# Patient Record
Sex: Female | Born: 1935
Health system: Southern US, Community
[De-identification: ages and names within clinical notes are randomized; demographics above are authoritative.]

## PROBLEM LIST (undated history)

## (undated) DIAGNOSIS — F419 Anxiety disorder, unspecified: Secondary | ICD-10-CM

## (undated) DIAGNOSIS — E785 Hyperlipidemia, unspecified: Secondary | ICD-10-CM

## (undated) DIAGNOSIS — R339 Retention of urine, unspecified: Secondary | ICD-10-CM

## (undated) DIAGNOSIS — N390 Urinary tract infection, site not specified: Secondary | ICD-10-CM

## (undated) DIAGNOSIS — F0390 Unspecified dementia without behavioral disturbance: Secondary | ICD-10-CM

## (undated) DIAGNOSIS — I10 Essential (primary) hypertension: Secondary | ICD-10-CM

## (undated) DIAGNOSIS — J189 Pneumonia, unspecified organism: Secondary | ICD-10-CM

## (undated) HISTORY — PX: ABDOMINAL HYSTERECTOMY: SHX81

## (undated) HISTORY — PX: CATARACT EXTRACTION: SUR2

## (undated) HISTORY — DX: Retention of urine, unspecified: R33.9

## (undated) HISTORY — DX: Urinary tract infection, site not specified: N39.0

## (undated) HISTORY — DX: Anxiety disorder, unspecified: F41.9

## (undated) HISTORY — DX: Pneumonia, unspecified organism: J18.9

---

## 2008-03-20 ENCOUNTER — Ambulatory Visit: Payer: Self-pay | Admitting: Family Medicine

## 2009-04-29 ENCOUNTER — Ambulatory Visit: Payer: Self-pay | Admitting: Family Medicine

## 2010-05-19 ENCOUNTER — Ambulatory Visit: Payer: Self-pay | Admitting: Family Medicine

## 2011-02-11 ENCOUNTER — Ambulatory Visit: Payer: Self-pay | Admitting: Family Medicine

## 2012-10-17 ENCOUNTER — Emergency Department: Payer: Self-pay | Admitting: Unknown Physician Specialty

## 2012-10-17 LAB — URINALYSIS, COMPLETE
Bacteria: NONE SEEN
Bilirubin,UR: NEGATIVE
Blood: NEGATIVE
Glucose,UR: NEGATIVE mg/dL (ref 0–75)
Ketone: NEGATIVE
Leukocyte Esterase: NEGATIVE
Nitrite: NEGATIVE
Squamous Epithelial: NONE SEEN
WBC UR: 1 /HPF (ref 0–5)

## 2012-10-17 LAB — CBC
HGB: 14.4 g/dL (ref 12.0–16.0)
MCH: 31 pg (ref 26.0–34.0)
MCHC: 33.9 g/dL (ref 32.0–36.0)
MCV: 92 fL (ref 80–100)
RDW: 13.1 % (ref 11.5–14.5)

## 2012-10-17 LAB — COMPREHENSIVE METABOLIC PANEL
Albumin: 3.2 g/dL — ABNORMAL LOW (ref 3.4–5.0)
Bilirubin,Total: 0.7 mg/dL (ref 0.2–1.0)
Calcium, Total: 8.6 mg/dL (ref 8.5–10.1)
Co2: 25 mmol/L (ref 21–32)
EGFR (African American): >60
EGFR (Non-African Amer.): 52 — ABNORMAL LOW
Potassium: 3.2 mmol/L — ABNORMAL LOW (ref 3.5–5.1)
SGPT (ALT): 29 U/L (ref 12–78)
Sodium: 144 mmol/L (ref 136–145)

## 2012-10-17 LAB — CK TOTAL AND CKMB (NOT AT ARMC): CK, Total: 80 U/L (ref 21–215)

## 2012-10-17 LAB — TROPONIN I: Troponin-I: <0.02 ng/mL

## 2012-10-17 LAB — TSH: Thyroid Stimulating Horm: 1.77 u[IU]/mL

## 2012-10-23 LAB — CULTURE, BLOOD (SINGLE)

## 2013-09-01 DIAGNOSIS — S93409A Sprain of unspecified ligament of unspecified ankle, initial encounter: Secondary | ICD-10-CM | POA: Diagnosis not present

## 2013-09-01 DIAGNOSIS — L02619 Cutaneous abscess of unspecified foot: Secondary | ICD-10-CM | POA: Diagnosis not present

## 2013-09-04 DIAGNOSIS — E538 Deficiency of other specified B group vitamins: Secondary | ICD-10-CM | POA: Diagnosis not present

## 2013-09-04 DIAGNOSIS — G309 Alzheimer's disease, unspecified: Secondary | ICD-10-CM | POA: Diagnosis not present

## 2013-09-04 DIAGNOSIS — E785 Hyperlipidemia, unspecified: Secondary | ICD-10-CM | POA: Diagnosis not present

## 2013-09-04 DIAGNOSIS — F0280 Dementia in other diseases classified elsewhere without behavioral disturbance: Secondary | ICD-10-CM | POA: Diagnosis not present

## 2013-10-03 DIAGNOSIS — M6740 Ganglion, unspecified site: Secondary | ICD-10-CM | POA: Diagnosis not present

## 2013-10-03 DIAGNOSIS — G608 Other hereditary and idiopathic neuropathies: Secondary | ICD-10-CM | POA: Diagnosis not present

## 2013-10-03 DIAGNOSIS — L608 Other nail disorders: Secondary | ICD-10-CM | POA: Diagnosis not present

## 2013-10-12 DIAGNOSIS — R059 Cough, unspecified: Secondary | ICD-10-CM | POA: Diagnosis not present

## 2013-10-12 DIAGNOSIS — J3489 Other specified disorders of nose and nasal sinuses: Secondary | ICD-10-CM | POA: Diagnosis not present

## 2013-10-12 DIAGNOSIS — R05 Cough: Secondary | ICD-10-CM | POA: Diagnosis not present

## 2013-10-26 DIAGNOSIS — R059 Cough, unspecified: Secondary | ICD-10-CM | POA: Diagnosis not present

## 2013-10-26 DIAGNOSIS — R05 Cough: Secondary | ICD-10-CM | POA: Diagnosis not present

## 2013-12-04 DIAGNOSIS — F0280 Dementia in other diseases classified elsewhere without behavioral disturbance: Secondary | ICD-10-CM | POA: Diagnosis not present

## 2013-12-04 DIAGNOSIS — G309 Alzheimer's disease, unspecified: Secondary | ICD-10-CM | POA: Diagnosis not present

## 2014-04-27 DIAGNOSIS — Z23 Encounter for immunization: Secondary | ICD-10-CM | POA: Diagnosis not present

## 2014-05-21 DIAGNOSIS — G308 Other Alzheimer's disease: Secondary | ICD-10-CM | POA: Diagnosis not present

## 2014-05-21 DIAGNOSIS — H1013 Acute atopic conjunctivitis, bilateral: Secondary | ICD-10-CM | POA: Diagnosis not present

## 2014-05-21 DIAGNOSIS — F0281 Dementia in other diseases classified elsewhere with behavioral disturbance: Secondary | ICD-10-CM | POA: Diagnosis not present

## 2014-05-21 DIAGNOSIS — L821 Other seborrheic keratosis: Secondary | ICD-10-CM | POA: Diagnosis not present

## 2014-06-06 DIAGNOSIS — R2689 Other abnormalities of gait and mobility: Secondary | ICD-10-CM | POA: Diagnosis not present

## 2014-06-06 DIAGNOSIS — E538 Deficiency of other specified B group vitamins: Secondary | ICD-10-CM | POA: Diagnosis not present

## 2014-06-06 DIAGNOSIS — F0280 Dementia in other diseases classified elsewhere without behavioral disturbance: Secondary | ICD-10-CM | POA: Diagnosis not present

## 2014-06-06 DIAGNOSIS — G301 Alzheimer's disease with late onset: Secondary | ICD-10-CM | POA: Diagnosis not present

## 2014-06-06 DIAGNOSIS — E785 Hyperlipidemia, unspecified: Secondary | ICD-10-CM | POA: Diagnosis not present

## 2014-06-11 DIAGNOSIS — R2689 Other abnormalities of gait and mobility: Secondary | ICD-10-CM | POA: Diagnosis not present

## 2014-06-11 DIAGNOSIS — Z9181 History of falling: Secondary | ICD-10-CM | POA: Diagnosis not present

## 2014-06-11 DIAGNOSIS — F0280 Dementia in other diseases classified elsewhere without behavioral disturbance: Secondary | ICD-10-CM | POA: Diagnosis not present

## 2014-06-11 DIAGNOSIS — G309 Alzheimer's disease, unspecified: Secondary | ICD-10-CM | POA: Diagnosis not present

## 2014-06-18 DIAGNOSIS — G309 Alzheimer's disease, unspecified: Secondary | ICD-10-CM | POA: Diagnosis not present

## 2014-06-18 DIAGNOSIS — F0280 Dementia in other diseases classified elsewhere without behavioral disturbance: Secondary | ICD-10-CM | POA: Diagnosis not present

## 2014-06-18 DIAGNOSIS — R2689 Other abnormalities of gait and mobility: Secondary | ICD-10-CM | POA: Diagnosis not present

## 2014-06-18 DIAGNOSIS — Z9181 History of falling: Secondary | ICD-10-CM | POA: Diagnosis not present

## 2014-06-19 DIAGNOSIS — R2689 Other abnormalities of gait and mobility: Secondary | ICD-10-CM | POA: Diagnosis not present

## 2014-06-19 DIAGNOSIS — F0280 Dementia in other diseases classified elsewhere without behavioral disturbance: Secondary | ICD-10-CM | POA: Diagnosis not present

## 2014-06-19 DIAGNOSIS — G309 Alzheimer's disease, unspecified: Secondary | ICD-10-CM | POA: Diagnosis not present

## 2014-06-19 DIAGNOSIS — Z9181 History of falling: Secondary | ICD-10-CM | POA: Diagnosis not present

## 2014-06-25 DIAGNOSIS — G309 Alzheimer's disease, unspecified: Secondary | ICD-10-CM | POA: Diagnosis not present

## 2014-06-25 DIAGNOSIS — R2689 Other abnormalities of gait and mobility: Secondary | ICD-10-CM | POA: Diagnosis not present

## 2014-06-25 DIAGNOSIS — Z9181 History of falling: Secondary | ICD-10-CM | POA: Diagnosis not present

## 2014-06-25 DIAGNOSIS — F0280 Dementia in other diseases classified elsewhere without behavioral disturbance: Secondary | ICD-10-CM | POA: Diagnosis not present

## 2014-06-27 DIAGNOSIS — R2689 Other abnormalities of gait and mobility: Secondary | ICD-10-CM | POA: Diagnosis not present

## 2014-06-27 DIAGNOSIS — F0280 Dementia in other diseases classified elsewhere without behavioral disturbance: Secondary | ICD-10-CM | POA: Diagnosis not present

## 2014-06-27 DIAGNOSIS — Z9181 History of falling: Secondary | ICD-10-CM | POA: Diagnosis not present

## 2014-06-27 DIAGNOSIS — G309 Alzheimer's disease, unspecified: Secondary | ICD-10-CM | POA: Diagnosis not present

## 2014-07-03 DIAGNOSIS — R2689 Other abnormalities of gait and mobility: Secondary | ICD-10-CM | POA: Diagnosis not present

## 2014-07-03 DIAGNOSIS — F0280 Dementia in other diseases classified elsewhere without behavioral disturbance: Secondary | ICD-10-CM | POA: Diagnosis not present

## 2014-07-03 DIAGNOSIS — G309 Alzheimer's disease, unspecified: Secondary | ICD-10-CM | POA: Diagnosis not present

## 2014-07-03 DIAGNOSIS — Z9181 History of falling: Secondary | ICD-10-CM | POA: Diagnosis not present

## 2014-07-05 DIAGNOSIS — F0280 Dementia in other diseases classified elsewhere without behavioral disturbance: Secondary | ICD-10-CM | POA: Diagnosis not present

## 2014-07-05 DIAGNOSIS — G309 Alzheimer's disease, unspecified: Secondary | ICD-10-CM | POA: Diagnosis not present

## 2014-07-05 DIAGNOSIS — R2689 Other abnormalities of gait and mobility: Secondary | ICD-10-CM | POA: Diagnosis not present

## 2014-07-05 DIAGNOSIS — Z9181 History of falling: Secondary | ICD-10-CM | POA: Diagnosis not present

## 2014-07-10 DIAGNOSIS — R2689 Other abnormalities of gait and mobility: Secondary | ICD-10-CM | POA: Diagnosis not present

## 2014-07-10 DIAGNOSIS — F0280 Dementia in other diseases classified elsewhere without behavioral disturbance: Secondary | ICD-10-CM | POA: Diagnosis not present

## 2014-07-10 DIAGNOSIS — G309 Alzheimer's disease, unspecified: Secondary | ICD-10-CM | POA: Diagnosis not present

## 2014-07-10 DIAGNOSIS — Z9181 History of falling: Secondary | ICD-10-CM | POA: Diagnosis not present

## 2014-07-12 DIAGNOSIS — Z9181 History of falling: Secondary | ICD-10-CM | POA: Diagnosis not present

## 2014-07-12 DIAGNOSIS — R2689 Other abnormalities of gait and mobility: Secondary | ICD-10-CM | POA: Diagnosis not present

## 2014-07-12 DIAGNOSIS — G309 Alzheimer's disease, unspecified: Secondary | ICD-10-CM | POA: Diagnosis not present

## 2014-07-12 DIAGNOSIS — F0280 Dementia in other diseases classified elsewhere without behavioral disturbance: Secondary | ICD-10-CM | POA: Diagnosis not present

## 2014-07-16 DIAGNOSIS — L239 Allergic contact dermatitis, unspecified cause: Secondary | ICD-10-CM | POA: Diagnosis not present

## 2014-07-16 DIAGNOSIS — S00411A Abrasion of right ear, initial encounter: Secondary | ICD-10-CM | POA: Diagnosis not present

## 2014-07-17 DIAGNOSIS — Z9181 History of falling: Secondary | ICD-10-CM | POA: Diagnosis not present

## 2014-07-17 DIAGNOSIS — F0280 Dementia in other diseases classified elsewhere without behavioral disturbance: Secondary | ICD-10-CM | POA: Diagnosis not present

## 2014-07-17 DIAGNOSIS — G309 Alzheimer's disease, unspecified: Secondary | ICD-10-CM | POA: Diagnosis not present

## 2014-07-17 DIAGNOSIS — R2689 Other abnormalities of gait and mobility: Secondary | ICD-10-CM | POA: Diagnosis not present

## 2014-07-20 DIAGNOSIS — Z9181 History of falling: Secondary | ICD-10-CM | POA: Diagnosis not present

## 2014-07-20 DIAGNOSIS — R2689 Other abnormalities of gait and mobility: Secondary | ICD-10-CM | POA: Diagnosis not present

## 2014-07-20 DIAGNOSIS — F0280 Dementia in other diseases classified elsewhere without behavioral disturbance: Secondary | ICD-10-CM | POA: Diagnosis not present

## 2014-07-20 DIAGNOSIS — G309 Alzheimer's disease, unspecified: Secondary | ICD-10-CM | POA: Diagnosis not present

## 2014-07-24 DIAGNOSIS — R2689 Other abnormalities of gait and mobility: Secondary | ICD-10-CM | POA: Diagnosis not present

## 2014-07-24 DIAGNOSIS — F0280 Dementia in other diseases classified elsewhere without behavioral disturbance: Secondary | ICD-10-CM | POA: Diagnosis not present

## 2014-07-24 DIAGNOSIS — G309 Alzheimer's disease, unspecified: Secondary | ICD-10-CM | POA: Diagnosis not present

## 2014-07-24 DIAGNOSIS — Z9181 History of falling: Secondary | ICD-10-CM | POA: Diagnosis not present

## 2014-07-25 DIAGNOSIS — G309 Alzheimer's disease, unspecified: Secondary | ICD-10-CM | POA: Diagnosis not present

## 2014-07-25 DIAGNOSIS — F0280 Dementia in other diseases classified elsewhere without behavioral disturbance: Secondary | ICD-10-CM | POA: Diagnosis not present

## 2014-07-25 DIAGNOSIS — R2689 Other abnormalities of gait and mobility: Secondary | ICD-10-CM | POA: Diagnosis not present

## 2014-07-25 DIAGNOSIS — Z9181 History of falling: Secondary | ICD-10-CM | POA: Diagnosis not present

## 2014-07-30 DIAGNOSIS — R2689 Other abnormalities of gait and mobility: Secondary | ICD-10-CM | POA: Diagnosis not present

## 2014-07-30 DIAGNOSIS — Z9181 History of falling: Secondary | ICD-10-CM | POA: Diagnosis not present

## 2014-07-30 DIAGNOSIS — F0280 Dementia in other diseases classified elsewhere without behavioral disturbance: Secondary | ICD-10-CM | POA: Diagnosis not present

## 2014-07-30 DIAGNOSIS — G309 Alzheimer's disease, unspecified: Secondary | ICD-10-CM | POA: Diagnosis not present

## 2014-08-02 DIAGNOSIS — G309 Alzheimer's disease, unspecified: Secondary | ICD-10-CM | POA: Diagnosis not present

## 2014-08-02 DIAGNOSIS — R2689 Other abnormalities of gait and mobility: Secondary | ICD-10-CM | POA: Diagnosis not present

## 2014-08-02 DIAGNOSIS — Z9181 History of falling: Secondary | ICD-10-CM | POA: Diagnosis not present

## 2014-08-02 DIAGNOSIS — F0280 Dementia in other diseases classified elsewhere without behavioral disturbance: Secondary | ICD-10-CM | POA: Diagnosis not present

## 2014-08-06 DIAGNOSIS — Z9181 History of falling: Secondary | ICD-10-CM | POA: Diagnosis not present

## 2014-08-06 DIAGNOSIS — R2689 Other abnormalities of gait and mobility: Secondary | ICD-10-CM | POA: Diagnosis not present

## 2014-08-06 DIAGNOSIS — F0280 Dementia in other diseases classified elsewhere without behavioral disturbance: Secondary | ICD-10-CM | POA: Diagnosis not present

## 2014-08-06 DIAGNOSIS — G309 Alzheimer's disease, unspecified: Secondary | ICD-10-CM | POA: Diagnosis not present

## 2014-08-21 DIAGNOSIS — Z23 Encounter for immunization: Secondary | ICD-10-CM | POA: Diagnosis not present

## 2014-08-21 DIAGNOSIS — E538 Deficiency of other specified B group vitamins: Secondary | ICD-10-CM | POA: Diagnosis not present

## 2014-08-21 DIAGNOSIS — Z0000 Encounter for general adult medical examination without abnormal findings: Secondary | ICD-10-CM | POA: Diagnosis not present

## 2014-08-21 DIAGNOSIS — E785 Hyperlipidemia, unspecified: Secondary | ICD-10-CM | POA: Diagnosis not present

## 2014-09-20 DIAGNOSIS — F0281 Dementia in other diseases classified elsewhere with behavioral disturbance: Secondary | ICD-10-CM | POA: Diagnosis not present

## 2014-09-20 DIAGNOSIS — F411 Generalized anxiety disorder: Secondary | ICD-10-CM | POA: Diagnosis not present

## 2014-10-24 DIAGNOSIS — L309 Dermatitis, unspecified: Secondary | ICD-10-CM | POA: Diagnosis not present

## 2014-11-02 DIAGNOSIS — R21 Rash and other nonspecific skin eruption: Secondary | ICD-10-CM | POA: Diagnosis not present

## 2014-11-05 DIAGNOSIS — L239 Allergic contact dermatitis, unspecified cause: Secondary | ICD-10-CM | POA: Diagnosis not present

## 2014-11-13 DIAGNOSIS — L239 Allergic contact dermatitis, unspecified cause: Secondary | ICD-10-CM | POA: Diagnosis not present

## 2014-11-14 ENCOUNTER — Inpatient Hospital Stay
Admit: 2014-11-14 | Disposition: A | Discharge status: Skilled Nursing Facility | Payer: Self-pay | Attending: Internal Medicine | Admitting: Internal Medicine

## 2014-11-14 DIAGNOSIS — R0602 Shortness of breath: Secondary | ICD-10-CM | POA: Diagnosis not present

## 2014-11-14 DIAGNOSIS — J189 Pneumonia, unspecified organism: Secondary | ICD-10-CM | POA: Diagnosis not present

## 2014-11-14 DIAGNOSIS — L259 Unspecified contact dermatitis, unspecified cause: Secondary | ICD-10-CM | POA: Diagnosis present

## 2014-11-14 DIAGNOSIS — I10 Essential (primary) hypertension: Secondary | ICD-10-CM | POA: Diagnosis not present

## 2014-11-14 DIAGNOSIS — E876 Hypokalemia: Secondary | ICD-10-CM | POA: Diagnosis present

## 2014-11-14 DIAGNOSIS — R21 Rash and other nonspecific skin eruption: Secondary | ICD-10-CM | POA: Diagnosis present

## 2014-11-14 DIAGNOSIS — M6281 Muscle weakness (generalized): Secondary | ICD-10-CM | POA: Diagnosis not present

## 2014-11-14 DIAGNOSIS — Z79899 Other long term (current) drug therapy: Secondary | ICD-10-CM | POA: Diagnosis not present

## 2014-11-14 DIAGNOSIS — Z66 Do not resuscitate: Secondary | ICD-10-CM | POA: Diagnosis present

## 2014-11-14 DIAGNOSIS — F0390 Unspecified dementia without behavioral disturbance: Secondary | ICD-10-CM | POA: Diagnosis present

## 2014-11-14 DIAGNOSIS — R262 Difficulty in walking, not elsewhere classified: Secondary | ICD-10-CM | POA: Diagnosis not present

## 2014-11-14 DIAGNOSIS — R509 Fever, unspecified: Secondary | ICD-10-CM | POA: Diagnosis not present

## 2014-11-14 DIAGNOSIS — J9811 Atelectasis: Secondary | ICD-10-CM | POA: Diagnosis not present

## 2014-11-14 DIAGNOSIS — R339 Retention of urine, unspecified: Secondary | ICD-10-CM | POA: Diagnosis present

## 2014-11-14 DIAGNOSIS — E785 Hyperlipidemia, unspecified: Secondary | ICD-10-CM | POA: Diagnosis not present

## 2014-11-14 DIAGNOSIS — J90 Pleural effusion, not elsewhere classified: Secondary | ICD-10-CM | POA: Diagnosis present

## 2014-11-14 DIAGNOSIS — Z466 Encounter for fitting and adjustment of urinary device: Secondary | ICD-10-CM | POA: Diagnosis not present

## 2014-11-14 DIAGNOSIS — R419 Unspecified symptoms and signs involving cognitive functions and awareness: Secondary | ICD-10-CM | POA: Diagnosis not present

## 2014-11-14 DIAGNOSIS — F0391 Unspecified dementia with behavioral disturbance: Secondary | ICD-10-CM | POA: Diagnosis not present

## 2014-11-14 DIAGNOSIS — R05 Cough: Secondary | ICD-10-CM | POA: Diagnosis not present

## 2014-11-14 LAB — URINALYSIS, COMPLETE
BACTERIA: NONE SEEN
BILIRUBIN, UR: NEGATIVE
Blood: NEGATIVE
GLUCOSE, UR: NEGATIVE mg/dL (ref 0–75)
LEUKOCYTE ESTERASE: NEGATIVE
Nitrite: NEGATIVE
Ph: 5 (ref 4.5–8.0)
Protein: 30
Specific Gravity: 1.03 (ref 1.003–1.030)

## 2014-11-14 LAB — COMPREHENSIVE METABOLIC PANEL
ALT: 16 U/L
ANION GAP: 7 (ref 7–16)
Albumin: 3 g/dL — ABNORMAL LOW
Alkaline Phosphatase: 83 U/L
BUN: 11 mg/dL
Bilirubin,Total: 1.4 mg/dL — ABNORMAL HIGH
CHLORIDE: 106 mmol/L
CREATININE: 0.92 mg/dL
Calcium, Total: 8.5 mg/dL — ABNORMAL LOW
Co2: 23 mmol/L
EGFR (African American): >60
GFR CALC NON AF AMER: 60 — AB
Glucose: 117 mg/dL — ABNORMAL HIGH
POTASSIUM: 3.4 mmol/L — AB
SGOT(AST): 21 U/L
Sodium: 136 mmol/L
TOTAL PROTEIN: 6.6 g/dL

## 2014-11-14 LAB — ED INFLUENZA
Influenza A By PCR: NEGATIVE
Influenza B By PCR: NEGATIVE

## 2014-11-14 LAB — CBC WITH DIFFERENTIAL/PLATELET
BASOS ABS: 0 10*3/uL (ref 0.0–0.1)
Basophil %: 0.5 %
Eosinophil #: 0.1 10*3/uL (ref 0.0–0.7)
Eosinophil %: 0.6 %
HCT: 42.8 % (ref 35.0–47.0)
HGB: 14.6 g/dL (ref 12.0–16.0)
LYMPHS PCT: 11.7 %
Lymphocyte #: 1.2 10*3/uL (ref 1.0–3.6)
MCH: 31.1 pg (ref 26.0–34.0)
MCHC: 34.1 g/dL (ref 32.0–36.0)
MCV: 91 fL (ref 80–100)
MONO ABS: 0.8 x10 3/mm (ref 0.2–0.9)
MONOS PCT: 7.4 %
NEUTROS PCT: 79.8 %
Neutrophil #: 8.2 10*3/uL — ABNORMAL HIGH (ref 1.4–6.5)
Platelet: 140 10*3/uL — ABNORMAL LOW (ref 150–440)
RBC: 4.69 10*6/uL (ref 3.80–5.20)
RDW: 12.8 % (ref 11.5–14.5)
WBC: 10.3 10*3/uL (ref 3.6–11.0)

## 2014-11-15 LAB — BASIC METABOLIC PANEL
ANION GAP: 8 (ref 7–16)
BUN: 9 mg/dL
CALCIUM: 8.5 mg/dL — AB
CHLORIDE: 104 mmol/L
Co2: 25 mmol/L
Creatinine: 0.82 mg/dL
Glucose: 115 mg/dL — ABNORMAL HIGH
POTASSIUM: 3.5 mmol/L
Sodium: 137 mmol/L

## 2014-11-15 LAB — CBC WITH DIFFERENTIAL/PLATELET
BASOS ABS: 0 10*3/uL (ref 0.0–0.1)
BASOS PCT: 0.1 %
EOS ABS: 0 10*3/uL (ref 0.0–0.7)
Eosinophil %: 0.2 %
HCT: 43.5 % (ref 35.0–47.0)
HGB: 14.7 g/dL (ref 12.0–16.0)
LYMPHS ABS: 0.7 10*3/uL — AB (ref 1.0–3.6)
Lymphocyte %: 7.2 %
MCH: 31.1 pg (ref 26.0–34.0)
MCHC: 33.8 g/dL (ref 32.0–36.0)
MCV: 92 fL (ref 80–100)
MONO ABS: 0.7 x10 3/mm (ref 0.2–0.9)
Monocyte %: 7.1 %
NEUTROS ABS: 8.4 10*3/uL — AB (ref 1.4–6.5)
Neutrophil %: 85.4 %
Platelet: 139 10*3/uL — ABNORMAL LOW (ref 150–440)
RBC: 4.73 10*6/uL (ref 3.80–5.20)
RDW: 12.8 % (ref 11.5–14.5)
WBC: 9.9 10*3/uL (ref 3.6–11.0)

## 2014-11-17 DIAGNOSIS — R0682 Tachypnea, not elsewhere classified: Secondary | ICD-10-CM | POA: Diagnosis not present

## 2014-11-17 DIAGNOSIS — Z466 Encounter for fitting and adjustment of urinary device: Secondary | ICD-10-CM | POA: Diagnosis not present

## 2014-11-17 DIAGNOSIS — R630 Anorexia: Secondary | ICD-10-CM | POA: Diagnosis not present

## 2014-11-17 DIAGNOSIS — J984 Other disorders of lung: Secondary | ICD-10-CM | POA: Diagnosis not present

## 2014-11-17 DIAGNOSIS — G301 Alzheimer's disease with late onset: Secondary | ICD-10-CM | POA: Diagnosis not present

## 2014-11-17 DIAGNOSIS — F0391 Unspecified dementia with behavioral disturbance: Secondary | ICD-10-CM | POA: Diagnosis not present

## 2014-11-17 DIAGNOSIS — J189 Pneumonia, unspecified organism: Secondary | ICD-10-CM | POA: Diagnosis not present

## 2014-11-17 DIAGNOSIS — E785 Hyperlipidemia, unspecified: Secondary | ICD-10-CM | POA: Diagnosis not present

## 2014-11-17 DIAGNOSIS — J181 Lobar pneumonia, unspecified organism: Secondary | ICD-10-CM | POA: Diagnosis not present

## 2014-11-17 DIAGNOSIS — R339 Retention of urine, unspecified: Secondary | ICD-10-CM | POA: Diagnosis not present

## 2014-11-17 DIAGNOSIS — J9611 Chronic respiratory failure with hypoxia: Secondary | ICD-10-CM | POA: Diagnosis not present

## 2014-11-17 DIAGNOSIS — F0280 Dementia in other diseases classified elsewhere without behavioral disturbance: Secondary | ICD-10-CM | POA: Diagnosis not present

## 2014-11-17 DIAGNOSIS — M6281 Muscle weakness (generalized): Secondary | ICD-10-CM | POA: Diagnosis not present

## 2014-11-17 DIAGNOSIS — R509 Fever, unspecified: Secondary | ICD-10-CM | POA: Diagnosis not present

## 2014-11-17 DIAGNOSIS — R0902 Hypoxemia: Secondary | ICD-10-CM | POA: Diagnosis not present

## 2014-11-17 DIAGNOSIS — R05 Cough: Secondary | ICD-10-CM | POA: Diagnosis not present

## 2014-11-17 DIAGNOSIS — J90 Pleural effusion, not elsewhere classified: Secondary | ICD-10-CM | POA: Diagnosis not present

## 2014-11-17 DIAGNOSIS — M4185 Other forms of scoliosis, thoracolumbar region: Secondary | ICD-10-CM | POA: Diagnosis not present

## 2014-11-17 DIAGNOSIS — R419 Unspecified symptoms and signs involving cognitive functions and awareness: Secondary | ICD-10-CM | POA: Diagnosis not present

## 2014-11-17 DIAGNOSIS — R0609 Other forms of dyspnea: Secondary | ICD-10-CM | POA: Diagnosis not present

## 2014-11-17 DIAGNOSIS — F329 Major depressive disorder, single episode, unspecified: Secondary | ICD-10-CM | POA: Diagnosis not present

## 2014-11-17 DIAGNOSIS — R0789 Other chest pain: Secondary | ICD-10-CM | POA: Diagnosis not present

## 2014-11-17 DIAGNOSIS — F0390 Unspecified dementia without behavioral disturbance: Secondary | ICD-10-CM | POA: Diagnosis not present

## 2014-11-17 DIAGNOSIS — R0602 Shortness of breath: Secondary | ICD-10-CM | POA: Diagnosis not present

## 2014-11-17 DIAGNOSIS — R262 Difficulty in walking, not elsewhere classified: Secondary | ICD-10-CM | POA: Diagnosis not present

## 2014-11-17 DIAGNOSIS — I10 Essential (primary) hypertension: Secondary | ICD-10-CM | POA: Diagnosis not present

## 2014-11-17 DIAGNOSIS — M47814 Spondylosis without myelopathy or radiculopathy, thoracic region: Secondary | ICD-10-CM | POA: Diagnosis not present

## 2014-11-17 DIAGNOSIS — R1011 Right upper quadrant pain: Secondary | ICD-10-CM | POA: Diagnosis not present

## 2014-11-19 LAB — CULTURE, BLOOD (SINGLE)

## 2014-11-20 DIAGNOSIS — R339 Retention of urine, unspecified: Secondary | ICD-10-CM | POA: Diagnosis not present

## 2014-11-20 DIAGNOSIS — F0390 Unspecified dementia without behavioral disturbance: Secondary | ICD-10-CM | POA: Diagnosis not present

## 2014-11-20 DIAGNOSIS — J181 Lobar pneumonia, unspecified organism: Secondary | ICD-10-CM | POA: Diagnosis not present

## 2014-11-22 DIAGNOSIS — R339 Retention of urine, unspecified: Secondary | ICD-10-CM | POA: Diagnosis not present

## 2014-12-02 NOTE — Discharge Summary (Signed)
PATIENT NAME:  Jordan [name], Jordan [name] MR#:  [id number] DATE OF BIRTH:  1934/10/03  DATE OF ADMISSION:  11/14/2014 DATE OF DISCHARGE:  11/17/2014  PRIMARY CARE PHYSICIAN: Katina DungBarbara D. Dayna BarkerAldridge, MD  FINAL DIAGNOSES: 1.  Pneumonia, likely community-acquired pneumonia. Could also be aspiration, with the pneumonia in the right upper lobe.  2.  History of dementia.  3.  Urinary retention, requiring straight catheterization.  MEDICATIONS ON DISCHARGE: Include: 1.  Fluocinonide topical  to affected area twice a day. 2.  Hydrocort cream 0.2 mg applied to face twice a day. 3.  Loratadine 10 mg daily. 4.  Namenda XR 28 mg daily. 5.  Pataday 0.2% ophthalmic solution, 1 drop, affected eye, once a day in the morning. 6.  Vitamin B12, 1000 mcg daily. 7.  Loperamide 2 mg every 3 hours as needed for diarrhea. 8.  Lorazepam 0.5 mg 1 tablet every 6 hours as needed for anxiety. 9.  Tylenol 500 mg every 4 hours as needed for fever or pain. 10.  Mylanta 30 mL q.4 hours as needed for indigestion. 11.  Milk of magnesia 8%, 30 mL once a day at bedtime as needed for constipation. 12.  Q-Tussin 10 mL every 6 hours as needed for cough. 13.  Triple antibiotic ointment, apply to affected areas p.r.n. for skin tears, abrasions, or minor irritation. 14.  Flomax 0.4 mg once a day after meal. 15.  Levaquin 750 mg every 24 hours for 5 days.  DIET: Regular diet, regular consistency.  ACTIVITY: As tolerated.  FOLLOWUP: 1.  One to 2 weeks with medical doctor or 1-2 days with doctor at rehabilitation. 2.  In-and-out catheter t.i.d. as needed for urinary retention. The patient will be high risk for urinary infections. Can consider a urology referral as outpatient.   HOSPITAL COURSE: The patient was admitted 11/14/2014, discharged to rehabilitation 11/17/2014. Came in with a fever. Admitted with pneumonia, right upper lobe. Started on antibiotics.  LABORATORY AND RADIOLOGICAL DATA DURING THE HOSPITAL COURSE: Included chest  x-ray shows right upper lobe infiltrate. Glucose 117, BUN 11, creatinine 0.92, sodium 136, potassium 3.4, chloride 106, CO2 of 23, calcium 8.5, total bilirubin 1.4. Other liver function tests normal range except for albumin low at 3.0. White blood cell count 10.3, [name] and [name] 14.6 and 42.8, platelet count of 140,000. Influenza test negative. Blood cultures negative. Urinalysis negative. White count upon discharge 9.9. Creatinine upon discharge 0.82. Potassium 3.5.  HOSPITAL COURSE PER PROBLEM LIST: 1.  For the patient's pneumonia, right upper lobe, likely community-acquired pneumonia. Could also be aspiration. Levaquin should cover both. Finish up a course of Levaquin. 2.  History of dementia on Namenda. 3.  Urinary retention requiring intermittent catheterizations. Flomax was started by my associate; not quite sure if that will help out or not. Can consider urology as outpatient. 4.  A lot of the other patient medications are for as-needed things.   TIME SPENT ON DISCHARGE: Greater than 30 minutes.   ____________________________ Herschell Dimesichard J. Renae GlossWieting, MD rjw:ST D: 11/17/2014 12:27:01 ET T: 11/17/2014 12:56:51 ET JOB#: [id number]  cc: Herschell Dimesichard J. Renae GlossWieting, MD, <Dictator> Katina DungBarbara D. Dayna BarkerAldridge, MD Discharge rehabilitation facility   Salley ScarletICHARD J Reeta Kuk MD ELECTRONICALLY SIGNED 11/21/2014 12:50

## 2014-12-02 NOTE — Consult Note (Signed)
PATIENT NAME:  Jordan [name], Jordan [name] MR#:  [id number] DATE OF BIRTH:  [birth year]  DATE OF CONSULTATION:  11/17/2014  ADDENDUM  MEDICATIONS: Nystatin cream, apply to buttock area twice a day for 14 days.  ____________________________ Herschell Dimesichard J. Renae GlossWieting, MD rjw:AT D: 11/17/2014 12:32:46 ET T: 11/17/2014 12:51:41 ET JOB#: [id number]  cc: Herschell Dimesichard J. Renae GlossWieting, MD, <Dictator> Salley ScarletICHARD J Maranatha Grossi MD ELECTRONICALLY SIGNED 11/21/2014 12:49

## 2014-12-02 NOTE — H&P (Signed)
PATIENT NAME:  Jordan [name], Jordan [name] MR#:  [id number] DATE OF BIRTH:  09/20/1934  DATE OF ADMISSION:  11/14/2014  CHIEF COMPLAINT: Fever.   HISTORY OF PRESENT ILLNESS: This is a 80 year old female who presented to the ED today from [facility] a complaint of cough and a fever to 102 degrees. The patient is demented and not able to provide a full history. Her son is present for the interview today and provides the majority of the history. He states that the patient has the cough for the past 2 days and developed a fever 1 day ago. Also was complaining of some mild abdominal pain. She recently was put on Keflex by her dermatologist for a rash that they were told was a contact dermatitis. She was also given some topical steroid creams for this rash. The patient states that she has had some productive cough with some sputum, but she cannot give any more details than that. In the ED, the patient was found to have a right upper lobe infiltrate on chest x-ray and given the fever and what looks like a pneumonia, hospitalists were called for admission.   PRIMARY CARE PHYSICIAN: Katina DungBarbara D. Dayna BarkerAldridge, MD   PAST MEDICAL HISTORY: On file, we had dementia, hypertension, hyperlipidemia, but her son states that she does not have hypertension or hyperlipidemia and she is only has dementia.   CURRENT MEDICATIONS: Ativan 0.5 mg q. 6 hours p.r.n., Namenda XR 28 mg daily, fluocinonide 0.05% cream topical to affected area b.i.d., Keflex 500 mg t.i.d. for 10 days; she has completed 8 days of this antibiotic.   PAST SURGICAL HISTORY: Cataracts and hysterectomy.   ALLERGIES: No known drug allergies.   FAMILY HISTORY: Stroke, cancer.   SOCIAL HISTORY: Nonsmoker, nondrinker. Denies illicit drug use.   REVIEW OF SYSTEMS: Limited due to the patient's dementia.  CONSTITUTIONAL: She endorses fever. Denies fatigue or weakness.  EYES: Denies blurred or double vision, pain or redness.  EAR, NOSE, AND THROAT:  Denies ear pain, hearing loss, difficulty swallowing.  RESPIRATORY: Endorses cough with some sputum production. Denies dyspnea or painful respiration.  CARDIOVASCULAR: Denies chest pain, edema, or palpitations.  GASTROINTESTINAL: Denies nausea, vomiting, diarrhea. Endorses some mild abdominal pain, but cannot elaborate on it. Denies constipation.  GENITOURINARY: Denies dysuria, hematuria, or frequency.  ENDOCRINE: Denies nocturia, thyroid problems, heat or cold intolerance.  HEMATOLOGIC AND LYMPHATIC: Denies easy bruising, bleeding, swollen glands.  INTEGUMENTARY: Denies acne, rash, or lesion.  MUSCULOSKELETAL: Denies acute arthritis, joint swelling, or gout.  NEUROLOGICAL: Denies numbness, weakness, headache.  PSYCHIATRIC: Denies anxiety, insomnia, depression.   PHYSICAL EXAMINATION:  VITAL SIGNS: Blood pressure 103/75, pulse 81, temperature 98.4, respirations 18 with 93% oxygen saturations on room air.  GENERAL: This is an elderly woman lying supine in bed in no acute distress.  HEENT: Pupils equal, round, and reactive to light and accommodation. Extraocular movements intact. No scleral icterus. Moist mucosal membranes.  NECK: Thyroid is not enlarged. Neck is supple with no masses, nontender. No cervical adenopathy. No JVD.  RESPIRATORY: Good air movement throughout all lung fields. She does have some mild right-sided crackles in the base and mid lung field, but otherwise no rhonchi and no wheezing. No respiratory distress.  CARDIOVASCULAR: Regular rate and rhythm. No murmurs, rubs, or gallops on exam. Good pedal pulses. No lower extremity edema.  ABDOMEN: Soft, nontender, nondistended. Good bowel sounds.  MUSCULOSKELETAL: Muscular strength 5/5 in all 4 extremities. Full spontaneous range of motion throughout. No cyanosis or clubbing.  SKIN:  No rash or lesions. Skin is warm, dry, and intact.  LYMPHATIC: No adenopathy.  NEUROLOGICAL: Cranial nerves intact. Sensation intact throughout. No  dysarthria or aphasia.  PSYCHIATRIC: The patient is alert, oriented to person, not oriented to time place or circumstance. She is cooperative and not agitated.   LABORATORY DATA: White count 10.3, hemoglobin 14.6, hematocrit 42.8, platelets 140,000. Sodium 136, potassium 3.4, chloride 106, CO2 of 23, BUN 11, creatinine 0.92, glucose 117, calcium 8.5. Total protein 6.6, albumin 3.0, total bilirubin 1.4, alkaline phosphatase 83, AST 21, ALT 16. Rapid flu is negative. Urinalysis negative.   RADIOLOGY: Chest x-ray, as per HPI, shows right upper lobe infiltrate, small bilateral pleural effusions, and bibasilar atelectasis.   ASSESSMENT AND PLAN:  1.  Healthcare-associated pneumonia. The patient has a right upper lobe pneumonia on chest x-ray. She was given antibiotics in the Emergency Department and will be continued on antibiotics inpatient. We will get respiratory culture. Monitor her vitals closely and watch for improvement.  2.  Bilateral pleural effusions. These are potentially an exacerbator of the patient's cough. They all seem to be causing any difficulty oxygenating or any respiratory distress at this time. We will monitor these for now for improvement.  3.  Dementia. The patient is on Namenda for this. We will continue Namenda here in the hospital.  4.  Deep vein thrombosis prophylaxis: Subcutaneous Lovenox.   CODE STATUS: This patient is DO NOT RESUSCITATE.   TIME SPENT ON THIS ADMISSION: 45 minutes.    ____________________________ Candace Cruise. Anne Hahn, MD dfw:bm D: 11/15/2014 02:03:32 ET T: 11/15/2014 02:51:03 ET JOB#: [id number]  cc: Candace Cruise. Anne Hahn, MD, <Dictator> Paizleigh Wilds Scotty Court MD ELECTRONICALLY SIGNED 11/15/2014 5:21

## 2014-12-03 ENCOUNTER — Telehealth: Payer: Self-pay | Admitting: *Deleted

## 2014-12-03 DIAGNOSIS — R509 Fever, unspecified: Secondary | ICD-10-CM | POA: Diagnosis not present

## 2014-12-03 DIAGNOSIS — R0682 Tachypnea, not elsewhere classified: Secondary | ICD-10-CM | POA: Diagnosis not present

## 2014-12-03 NOTE — Telephone Encounter (Signed)
PLEASE NOTE: All timestamps contained within this report are represented as [place] Time. CONFIDENTIALTY NOTICE: This fax transmission is intended only for the addressee. It contains information that is legally privileged, confidential or otherwise protected from use or disclosure. If you are not the intended recipient, you are strictly prohibited from reviewing, disclosing, copying using or disseminating any of this information or taking any action in reliance on or regarding this information. If you have received this fax in error, please notify us immediately by telephone so that we can arrange for its return to us. Phone: [phone], Toll-Free: [phone], Fax: [phone] Page: 1 of 1 Call Id: [id number] [place] [facility] - Client TELEPHONE ADVICE RECORD [facility] Patient Name: Adonis HugueninOMMIE Germany Gender: Female DOB: 03/24/1935 Age: [age] Y 2 M 29 D Return Phone Number: Address: City/State/Zip: [place] StatisticianClient [facility] - [facility] - Night Physician Tillman AbideLetvak, Richard Contact Type Call Call Type Page Only Caller Name Judeth CornfieldStephanie [phone] Relationship To Patient Provider Is this call to report lab results? No Return Phone Number Unavailable Initial Comment Stephanie RN with [facility], confused, chest pain, high resp rate. Nurse Assessment Guidelines Guideline Title Affirmed Question Affirmed Notes Nurse Date/Time (Eastern Time) Disp. Time Lamount Cohen(Eastern Time) Disposition Final User 12/01/2014 1:56:04 PM Send to [facility] Paging Queue Burt EkBrown, Ashleigh 12/01/2014 2:04:51 PM Paged On Call to Other Provider Chilton SiGreen, Amy 12/01/2014 2:05:16 PM Page Completed Yes Chilton SiGreen, Amy After Care Instructions Given Call Event Type User Date / Time Description Paging DoctorName Phone DateTime Result/Outcome Message Type Notes Tillman AbideLetvak, Richard [phone] 12/01/2014 2:04:51 PM Paged On Call to Other  Provider Doctor Paged Msg from Amy at the Call Center - Please call [facility]/ [facility] @ [phone] Tillman AbideLetvak, Richard 12/01/2014 2:04:59 PM Paged On Call to Another Provider Message Result

## 2014-12-03 NOTE — Telephone Encounter (Signed)
I evaluated her today

## 2014-12-05 DIAGNOSIS — F329 Major depressive disorder, single episode, unspecified: Secondary | ICD-10-CM | POA: Diagnosis not present

## 2014-12-05 DIAGNOSIS — G301 Alzheimer's disease with late onset: Secondary | ICD-10-CM | POA: Diagnosis not present

## 2014-12-05 DIAGNOSIS — F0280 Dementia in other diseases classified elsewhere without behavioral disturbance: Secondary | ICD-10-CM | POA: Diagnosis not present

## 2014-12-05 DIAGNOSIS — R630 Anorexia: Secondary | ICD-10-CM | POA: Diagnosis not present

## 2014-12-07 DIAGNOSIS — R0789 Other chest pain: Secondary | ICD-10-CM | POA: Diagnosis not present

## 2014-12-10 ENCOUNTER — Telehealth: Payer: Self-pay

## 2014-12-10 DIAGNOSIS — R0682 Tachypnea, not elsewhere classified: Secondary | ICD-10-CM | POA: Diagnosis not present

## 2014-12-10 NOTE — Telephone Encounter (Signed)
Seen today Stable but ongoing issues Echo and pulm consult ordered

## 2014-12-10 NOTE — Telephone Encounter (Signed)
PLEASE NOTE: All timestamps contained within this report are represented as [place] Standard Time. CONFIDENTIALTY NOTICE: This fax transmission is intended only for the addressee. It contains information that is legally privileged, confidential or otherwise protected from use or disclosure. If you are not the intended recipient, you are strictly prohibited from reviewing, disclosing, copying using or disseminating any of this information or taking any action in reliance on or regarding this information. If you have received this fax in error, please notify us immediately by telephone so that we can arrange for its return to us. Phone: [phone], Toll-Free: [phone], Fax: [phone] Page: 1 of 1 Call Id: [id number] [place] [facility] Client TELEPHONE ADVICE RECORD [facility] Patient Name: Jordan HugueninOMMIE [name] Gender: Female DOB: 12/06/1934 Age: [age] Y 3 M 7 D Return Phone Number: Address: City/State/Zip: [place] StatisticianClient [place] [facility] - [facility]  [facility] - Night Physician Tillman AbideLetvak, Richard Contact Type Call Call Type Page Only Caller Name Sherol DadeStephanie Kennedy RN with [facility] Relationship To Patient Care Giver Is this call to report lab results? No Return Phone Number Unavailable Initial Comment Caller states she needs to speak to on call doctor regarding patient's stats.Her respiration is 30. Heart Rate 100. BP 106/78. and Oxygen is 96. and Oral temp is 98.8. [phone] Nurse Assessment Guidelines Guideline Title Affirmed Question Affirmed Notes Nurse Date/Time (Eastern Time) Disp. Time Lamount Cohen(Eastern Time) Disposition Final User 12/09/2014 2:34:10 PM [facility] to [facility] Paging Queue Ulice DashScott, Ashley 12/09/2014 2:47:30 PM Paged On Call to Other Provider Chilton SiGreen, Amy 12/09/2014 2:47:58 PM Page Completed Yes Chilton SiGreen, Amy After Care Instructions Given Call Event Type User Date / Time  Description Paging [facility] Phone DateTime Result/Outcome Message Type Notes Santiago BumpersMcGowen, Phil [phone] 12/09/2014 2:47:30 PM Paged On Call to Other Provider Doctor Paged Msg from Amy @ Call Center - Please call Sherol DadeStephanie Kennedy RN with [facility] @ [phone] Santiago BumpersMcGowen, Phil 12/09/2014 2:47:35 PM Paged On Call to Another Provider Message Result

## 2014-12-13 ENCOUNTER — Telehealth: Payer: Self-pay | Admitting: Pulmonary Disease

## 2014-12-13 ENCOUNTER — Encounter: Payer: Self-pay | Admitting: Pulmonary Disease

## 2014-12-13 ENCOUNTER — Encounter (INDEPENDENT_AMBULATORY_CARE_PROVIDER_SITE_OTHER): Payer: Self-pay

## 2014-12-13 ENCOUNTER — Ambulatory Visit
Admission: RE | Admit: 2014-12-13 | Discharge: 2014-12-13 | Disposition: A | Discharge status: Home / Self Care | Payer: No Typology Code available for payment source | Source: Ambulatory Visit | Attending: Pulmonary Disease | Admitting: Pulmonary Disease

## 2014-12-13 ENCOUNTER — Ambulatory Visit (INDEPENDENT_AMBULATORY_CARE_PROVIDER_SITE_OTHER): Payer: Medicare Other | Admitting: Pulmonary Disease

## 2014-12-13 VITALS — BP 118/66 | HR 87 | Ht 65.0 in | Wt 149.0 lb

## 2014-12-13 DIAGNOSIS — J9611 Chronic respiratory failure with hypoxia: Secondary | ICD-10-CM | POA: Diagnosis not present

## 2014-12-13 DIAGNOSIS — J189 Pneumonia, unspecified organism: Secondary | ICD-10-CM | POA: Diagnosis not present

## 2014-12-13 DIAGNOSIS — M4185 Other forms of scoliosis, thoracolumbar region: Secondary | ICD-10-CM | POA: Insufficient documentation

## 2014-12-13 DIAGNOSIS — J984 Other disorders of lung: Secondary | ICD-10-CM | POA: Diagnosis not present

## 2014-12-13 DIAGNOSIS — R0602 Shortness of breath: Secondary | ICD-10-CM

## 2014-12-13 DIAGNOSIS — M47814 Spondylosis without myelopathy or radiculopathy, thoracic region: Secondary | ICD-10-CM | POA: Insufficient documentation

## 2014-12-13 LAB — BASIC METABOLIC PANEL
Anion gap: 8 (ref 5–15)
BUN: 11 mg/dL (ref 6–20)
CO2: 22 mmol/L (ref 22–32)
Calcium: 9.2 mg/dL (ref 8.9–10.3)
Chloride: 108 mmol/L (ref 101–111)
Creatinine, Ser: 0.81 mg/dL (ref 0.44–1.00)
GFR calc Af Amer: >60 mL/min (ref >60)
GFR calc non Af Amer: >60 mL/min (ref >60)
GLUCOSE: 118 mg/dL — AB (ref 65–99)
POTASSIUM: 4.2 mmol/L (ref 3.5–5.1)
SODIUM: 138 mmol/L (ref 135–145)

## 2014-12-13 LAB — BRAIN NATRIURETIC PEPTIDE: B NATRIURETIC PEPTIDE 5: 60 pg/mL (ref 0.0–100.0)

## 2014-12-13 NOTE — Assessment & Plan Note (Signed)
Continue using 2 L daily at bedtime

## 2014-12-13 NOTE — Telephone Encounter (Signed)
Received call report from Erskine SquibbJane w/ [facility] Pt seen today in [place] by BQ  IMPRESSION: Right upper lobe density has not significantly changed over the last month and may correspond with a subpleural process on the lateral view. While this could be benign, it is new from 2014, and neoplasm cannot be excluded. Chest CT recommended for further evaluation.  Will forward to BQ as FYI

## 2014-12-13 NOTE — Assessment & Plan Note (Signed)
She has worsening shortness of breath since her episode of pneumonia approximately 1 month ago. On exam, she has markedly abnormal lungs with crackles and some findings that are worrisome for a right-sided pleural effusion. I explained to the family that this may be scarring from her episode of pneumonia versus pulmonary edema from yet undiagnosed congestive heart failure. Alternatively, she may have chronic, recurrent aspiration. The remainder of her exam was normal and there was no evidence of volume overload on her cardiac and extremity exam.  The family is choosing to take plan of care that focuses primarily on her symptoms, I completely agree with this. However, it would be worthwhile to do some basic testing see if there are some easy interventions we can make to improve her quality of life. The family states clearly that they do not feel she would want her diet to be changed. To that end, I don't think it would be necessary to do swallowing testing.  Plan: Chest x-ray Take Mucinex twice a day  proBNP consider diuretic, depending on results of proBNP and x-ray Follow-up in 3-4 weeks or sooner if needed

## 2014-12-13 NOTE — Patient Instructions (Signed)
We will get a chest x-ray and call you with the results Take Mucinex (guaifenesin) 600 mg twice a day matter how you feel We will call you with the results of the blood test and let you know if we are going to do anything differently Keep using oxygen walk and when you sleep We will see you back in 3-4 weeks or sooner if needed

## 2014-12-13 NOTE — Progress Notes (Signed)
Subjective:    Patient ID: Jordan [name], female    DOB: 03/05/1935, 80 y.o.   MRN: [id number]  HPI Chief Complaint  Patient presents with  . Advice Only    Referred by Dr Alphonsus SiasLetvak for pulmonary.  [facility].     Ms Jordan [name] was referred for evaluation of shortness of breath. Hospitalized in April 2016 or community-acquired pneumonia. Prior to this she never had a hospitalization for a respiratory problem nor did she carry a diagnosis of a chronic lung disease. Her son says that for about 20 years she would smoke only twice a year when she would get together with family. Aside from that she was never a regular smoker.  Her major medical problems in the last several years have been focused around her dementia. She has been in an assisted living facility for the last 2 years. This was her first hospitalization. At the time of her hospitalization she had a per to 102 and was short of breath. They said that IV antibiotics for 3 days followed by oral antibiotics. Since the hospital stay she has been relocated to a skilled nursing facility here in town. Since then, her family says that her shortness of breath has worsened. They also note that she has a dry cough. Sometimes it sounds like there is mucus in her chest. She does not complain of chest pain.  Her appetite has been poor since coming home from the hospital. She has been using oxygen when she walks around and when she sleeps.  Past Medical History  Diagnosis Date  . UTI (lower urinary tract infection)   . Pneumonia      Family History  Problem Relation Age of Onset  . Cancer Sister     breast     History   Social History  . Marital Status: Divorced    Spouse Name: N/A  . Number of Children: N/A  . Years of Education: N/A   Occupational History  . Not on file.   Social History Main Topics  . Smoking status: Former Smoker -- 0.10 packs/day for 20 years    Types: Cigarettes    Quit date: 12/13/1974  .  Smokeless tobacco: Never Used     Comment: former some day social smoker per son  . Alcohol Use: Not on file  . Drug Use: Not on file  . Sexual Activity: Not on file   Other Topics Concern  . Not on file   Social History Narrative  . No narrative on file     No Known Allergies   No outpatient prescriptions prior to visit.   No facility-administered medications prior to visit.       Review of Systems  Constitutional: Negative for fever and unexpected weight change.  HENT: Negative for congestion, dental problem, ear pain, nosebleeds, postnasal drip, rhinorrhea, sinus pressure, sneezing, sore throat and trouble swallowing.   Eyes: Negative for redness and itching.  Respiratory: Positive for cough, chest tightness and shortness of breath. Negative for wheezing.   Cardiovascular: Negative for palpitations and leg swelling.  Gastrointestinal: Negative for nausea and vomiting.  Genitourinary: Negative for dysuria.  Musculoskeletal: Negative for joint swelling.  Skin: Negative for rash.  Neurological: Negative for headaches.  Hematological: Does not bruise/bleed easily.  Psychiatric/Behavioral: Negative for dysphoric mood. The patient is not nervous/anxious.        Objective:   Physical Exam Filed Vitals:   12/13/14 0926  BP: 118/66  Pulse: 87  Height: 5\' 5"  (1.651 m)  Weight: 149 lb (67.586 kg)  SpO2: 96%   Room air  Ambulated approximately 100 feet and became very short of breath. Her O2 saturation was greater than 90% this time.  Gen: chronically ill appearing, no acute distress HENT: NCAT, OP clear, neck supple without masses Eyes: PERRL, EOMi Lymph: no cervical lymphadenopathy PULM: Crackles throughout lungs bilaterally, worse RUL, diminished breath sounds RLL and dullness to percussion CV: RRR, no mgr, no JVD GI: BS+, soft, nontender, no hsm Derm: no rash or skin breakdown MSK: normal bulk and tone Neuro: Awake and alert, only answers questions with yes or  no answers, follows commands, CN II-XII intact, strength 5/5 in all 4 extremities Psyche: normal mood and affect  PCP notes reviewed from April where she had normal kidney function      Assessment & Plan:  @REVDATA @  Shortness of breath She has worsening shortness of breath since her episode of pneumonia approximately 1 month ago. On exam, she has markedly abnormal lungs with crackles and some findings that are worrisome for a right-sided pleural effusion. I explained to the family that this may be scarring from her episode of pneumonia versus pulmonary edema from yet undiagnosed congestive heart failure. Alternatively, she may have chronic, recurrent aspiration. The remainder of her exam was normal and there was no evidence of volume overload on her cardiac and extremity exam.  The family is choosing to take plan of care that focuses primarily on her symptoms, I completely agree with this. However, it would be worthwhile to do some basic testing see if there are some easy interventions we can make to improve her quality of life. The family states clearly that they do not feel she would want her diet to be changed. To that end, I don't think it would be necessary to do swallowing testing.  Plan: Chest x-ray Take Mucinex twice a day  proBNP consider diuretic, depending on results of proBNP and x-ray Follow-up in 3-4 weeks or sooner if needed   Chronic respiratory failure with hypoxemia Continue using 2 L daily at bedtime   CAP (community acquired pneumonia) She had a recent hospitalization for community-acquired pneumonia. Considering her dementia this was most likely related to aspiration.  I have personally reviewed the images form the CXR from 11/2014 which showed a RUL infiltrate and small R sided pleural effusion.  I worry that this effusion may be worse.  It was never sampled.  She may benefit from a thoracentesis if she has a large pleural effusion.  Plan: Obtain CXR    Consider thoracentesis     Raegyn was seen today for advice only.  Diagnoses and all orders for this visit:  Pneumonia, organism unspecified Orders: -     DG Chest 2 View; Future -     DG Chest 2 View  SOB (shortness of breath) Orders: -     B Nat Peptide; Future -     B Nat Peptide  CAP (community acquired pneumonia)  Chronic respiratory failure with hypoxemia  Shortness of breath   @EXTENDED @

## 2014-12-13 NOTE — Assessment & Plan Note (Signed)
She had a recent hospitalization for community-acquired pneumonia. Considering her dementia this was most likely related to aspiration.  I have personally reviewed the images form the CXR from 11/2014 which showed a RUL infiltrate and small R sided pleural effusion.  I worry that this effusion may be worse.  It was never sampled.  She may benefit from a thoracentesis if she has a large pleural effusion.  Plan: Obtain CXR  Consider thoracentesis

## 2014-12-14 NOTE — Telephone Encounter (Signed)
I called her son today to discuss this result. I actually feel that the chest x-ray has shown some improvement since the last one. Her son agrees with me that there is not really a role for performing a CT scan at this time. We'll get a chest x-ray when they come back to clinic next month.

## 2014-12-21 ENCOUNTER — Ambulatory Visit (INDEPENDENT_AMBULATORY_CARE_PROVIDER_SITE_OTHER): Payer: Medicare Other

## 2014-12-21 ENCOUNTER — Other Ambulatory Visit: Payer: Self-pay

## 2014-12-21 ENCOUNTER — Other Ambulatory Visit: Payer: Self-pay | Admitting: Internal Medicine

## 2014-12-21 DIAGNOSIS — R0902 Hypoxemia: Secondary | ICD-10-CM

## 2014-12-21 DIAGNOSIS — R0609 Other forms of dyspnea: Secondary | ICD-10-CM

## 2014-12-24 DIAGNOSIS — R339 Retention of urine, unspecified: Secondary | ICD-10-CM | POA: Diagnosis not present

## 2015-01-02 ENCOUNTER — Encounter: Payer: Self-pay | Admitting: Pulmonary Disease

## 2015-01-02 ENCOUNTER — Ambulatory Visit (INDEPENDENT_AMBULATORY_CARE_PROVIDER_SITE_OTHER): Payer: Medicare Other | Admitting: Pulmonary Disease

## 2015-01-02 VITALS — BP 118/72 | HR 66 | Ht 65.0 in | Wt 146.0 lb

## 2015-01-02 DIAGNOSIS — J309 Allergic rhinitis, unspecified: Secondary | ICD-10-CM | POA: Insufficient documentation

## 2015-01-02 DIAGNOSIS — J3089 Other allergic rhinitis: Secondary | ICD-10-CM | POA: Diagnosis not present

## 2015-01-02 DIAGNOSIS — J189 Pneumonia, unspecified organism: Secondary | ICD-10-CM | POA: Diagnosis not present

## 2015-01-02 NOTE — Patient Instructions (Signed)
Take generic zyrtec daily Take nasacort two puffs each nostril daily Stop claritin Follow up with us if worse

## 2015-01-02 NOTE — Assessment & Plan Note (Signed)
This has been worse recently.  Plan: Stop Claritin Zyrtec daily Nasacort 2 puffs each nostril daily

## 2015-01-02 NOTE — Progress Notes (Signed)
Subjective:    Patient ID: Jordan [name], female    DOB: [birth year], 80 y.o.   MRN: [id number]  Synopsis: Reata was referred in May 2016 for evaluation after CAP.  She has advanced dementia.  Son is intersted in quality of life.   HPI Chief Complaint  Patient presents with  . Follow-up    Pt states she is doing well today.  Pt's son states the nurses at her facility state she's complained of L sided chest pain 2X since last ov.     Wendelin has been breathing better since the last visit.  She had a normal echocardiogram recently. She only has a rare cough, no mucus production.  Allergies have been a problem, she is taking claritin. She has a runny nose, some headache.    Past Medical History  Diagnosis Date  . UTI (lower urinary tract infection)   . Pneumonia       Review of Systems     Objective:   Physical Exam Filed Vitals:   01/02/15 0939  BP: 118/72  Pulse: 66  Height:  (1.651 m)  Weight: 146 lb (66.225 kg)  SpO2: 92%  RA  Gen: well appearing HENT: OP clear, TM's clear, neck supple PULM: Crackles R lung more than left base, no wheezing, good air movement CV: RRR, no mgr, trace edema GI: BS+, soft, nontender Derm: no cyanosis or rash Psyche: normal mood and affect      Assessment & Plan:  CAP (community acquired pneumonia) This has resolved though she continues to have some crackles in her lungs. She has minimal cough and no shortness of breath and no mucus production. So fortunately she seems to have recovered. She does not have witnessed episodes of aspiration but I remain concerned that she has silent aspiration. I've asked her son to be on the look out for this. However, considering the fact that his primary focus for her is her quality of life then I see little need to change her diet empirically.  Plan: Follow-up with me as needed, if shortness of breath worsens or pneumonia recurs   Allergic rhinitis This has been worse  recently.  Plan: Stop Claritin Zyrtec daily Nasacort 2 puffs each nostril daily      Current outpatient prescriptions:  .  acetaminophen (TYLENOL) 500 MG tablet, Take 500 mg by mouth every 6 (six) hours as needed., Disp: , Rfl:  .  alum & mag hydroxide-simeth (MAALOX PLUS) 400-400-40 MG/5ML suspension, Take 5 mLs by mouth every 6 (six) hours as needed for indigestion., Disp: , Rfl:  .  guaiFENesin (MUCINEX) 600 MG 12 hr tablet, Take 600 mg by mouth 2 (two) times daily., Disp: , Rfl:  .  guaiFENesin (ROBITUSSIN) 100 MG/5ML liquid, Take 200 mg by mouth 4 (four) times daily as needed for cough., Disp: , Rfl:  .  loperamide (IMODIUM) 2 MG capsule, Take 2 mg by mouth every 3 (three) hours as needed for diarrhea or loose stools., Disp: , Rfl:  .  loratadine (CLARITIN) 10 MG tablet, Take 10 mg by mouth daily., Disp: , Rfl:  .  magnesium hydroxide (MILK OF MAGNESIA) 400 MG/5ML suspension, Take 30 mLs by mouth at bedtime as needed for mild constipation., Disp: , Rfl:  .  memantine (NAMENDA XR) 28 MG CP24 24 hr capsule, Take 28 mg by mouth., Disp: , Rfl:  .  mirtazapine (REMERON) 15 MG tablet, Take 15 mg by mouth at bedtime., Disp: , Rfl:  .  NON  FORMULARY, [employer]- give 4 oz by mouth at bedtime (add chocolate syrup as needed), Disp: , Rfl:  .  Olopatadine HCl 0.2 % SOLN, Apply 1 drop to eye daily., Disp: , Rfl:  .  vitamin B-12 (CYANOCOBALAMIN) 1000 MCG tablet, Take 1,000 mcg by mouth daily., Disp: , Rfl:

## 2015-01-02 NOTE — Assessment & Plan Note (Signed)
This has resolved though she continues to have some crackles in her lungs. She has minimal cough and no shortness of breath and no mucus production. So fortunately she seems to have recovered. She does not have witnessed episodes of aspiration but I remain concerned that she has silent aspiration. I've asked her son to be on the look out for this. However, considering the fact that his primary focus for her is her quality of life then I see little need to change her diet empirically.  Plan: Follow-up with me as needed, if shortness of breath worsens or pneumonia recurs

## 2015-01-30 IMAGING — CR DG CHEST 1V PORT
1 series · 1 of 1 positions shown · non-contrast
Comparison: none

REASON FOR EXAM: ams weakness
COMMENTS:

[ap]
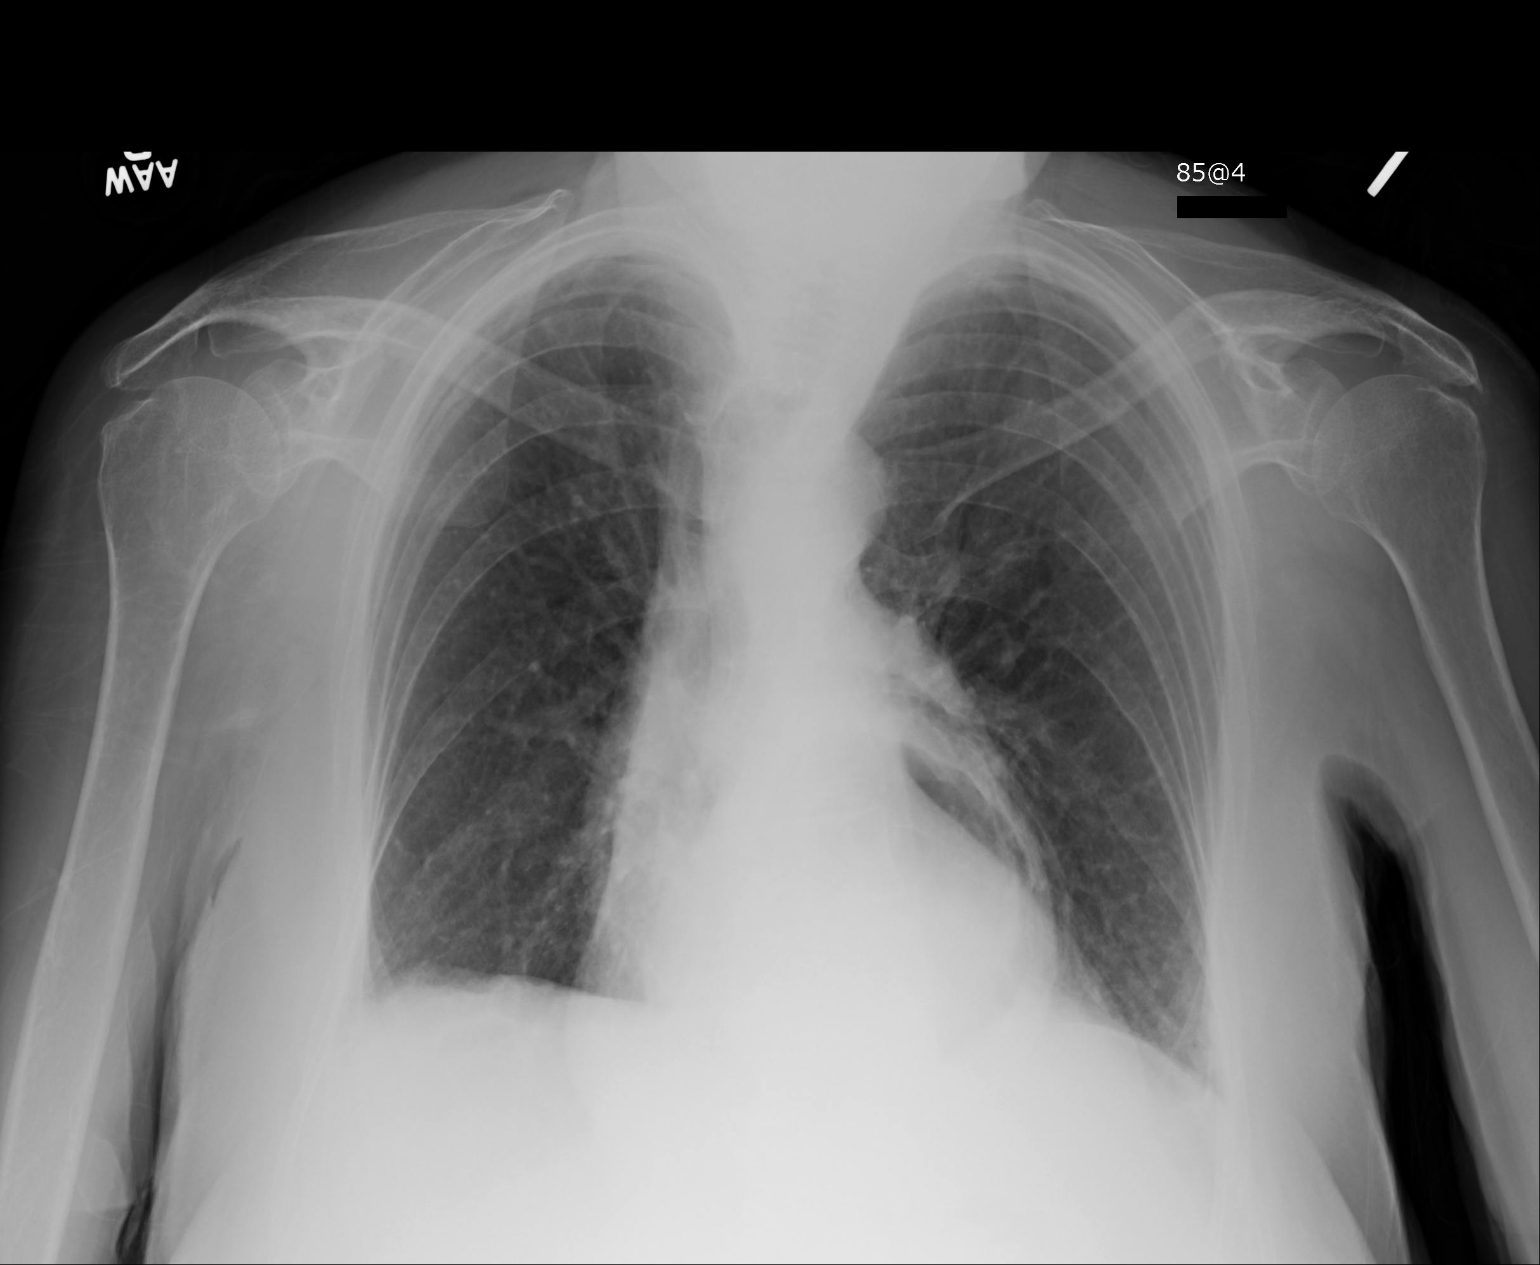

[1 of 1 positions shown; findings below may reference images not displayed]

PROCEDURE:     DXR - DXR PORTABLE CHEST SINGLE VIEW  - October 17, 2012  [DATE]

RESULT:     The lungs are adequately inflated. There is likely a large
hiatal hernia-partially intrathoracic stomach. The cardiac silhouette is top
normal in size. The pulmonary vascularity is minimally prominent centrally.
There is no pleural effusion or pneumothorax or alveolar pneumonia.
IMPRESSION: There is no evidence of CHF nor of pneumonia. There is a
large hiatal hernia gas partially intrathoracic stomach. A followup PA and
lateral chest x-ray would be of value when the patient can tolerate the
procedure.

[REDACTED]

## 2015-02-07 DIAGNOSIS — G301 Alzheimer's disease with late onset: Secondary | ICD-10-CM

## 2015-02-07 DIAGNOSIS — R339 Retention of urine, unspecified: Secondary | ICD-10-CM

## 2015-02-07 DIAGNOSIS — F39 Unspecified mood [affective] disorder: Secondary | ICD-10-CM

## 2015-03-06 ENCOUNTER — Other Ambulatory Visit: Payer: Self-pay

## 2015-03-06 ENCOUNTER — Observation Stay
Admission: EM | Admit: 2015-03-06 | Discharge: 2015-03-08 | Disposition: A | Discharge status: Skilled Nursing Facility | Payer: Medicare Other | Attending: Internal Medicine | Admitting: Internal Medicine

## 2015-03-06 ENCOUNTER — Emergency Department: Payer: Medicare Other

## 2015-03-06 ENCOUNTER — Encounter: Payer: Self-pay | Admitting: Emergency Medicine

## 2015-03-06 DIAGNOSIS — M6281 Muscle weakness (generalized): Secondary | ICD-10-CM | POA: Diagnosis not present

## 2015-03-06 DIAGNOSIS — B9689 Other specified bacterial agents as the cause of diseases classified elsewhere: Secondary | ICD-10-CM | POA: Diagnosis not present

## 2015-03-06 DIAGNOSIS — J309 Allergic rhinitis, unspecified: Secondary | ICD-10-CM | POA: Diagnosis not present

## 2015-03-06 DIAGNOSIS — Z8249 Family history of ischemic heart disease and other diseases of the circulatory system: Secondary | ICD-10-CM | POA: Diagnosis not present

## 2015-03-06 DIAGNOSIS — Z803 Family history of malignant neoplasm of breast: Secondary | ICD-10-CM | POA: Diagnosis not present

## 2015-03-06 DIAGNOSIS — R0902 Hypoxemia: Secondary | ICD-10-CM

## 2015-03-06 DIAGNOSIS — R0602 Shortness of breath: Secondary | ICD-10-CM | POA: Diagnosis not present

## 2015-03-06 DIAGNOSIS — F0390 Unspecified dementia without behavioral disturbance: Secondary | ICD-10-CM | POA: Diagnosis not present

## 2015-03-06 DIAGNOSIS — J9811 Atelectasis: Secondary | ICD-10-CM | POA: Diagnosis not present

## 2015-03-06 DIAGNOSIS — R509 Fever, unspecified: Secondary | ICD-10-CM | POA: Insufficient documentation

## 2015-03-06 DIAGNOSIS — E785 Hyperlipidemia, unspecified: Secondary | ICD-10-CM | POA: Diagnosis not present

## 2015-03-06 DIAGNOSIS — Z87891 Personal history of nicotine dependence: Secondary | ICD-10-CM | POA: Diagnosis not present

## 2015-03-06 DIAGNOSIS — B9620 Unspecified Escherichia coli [E. coli] as the cause of diseases classified elsewhere: Secondary | ICD-10-CM | POA: Diagnosis not present

## 2015-03-06 DIAGNOSIS — G9341 Metabolic encephalopathy: Secondary | ICD-10-CM | POA: Insufficient documentation

## 2015-03-06 DIAGNOSIS — R918 Other nonspecific abnormal finding of lung field: Secondary | ICD-10-CM | POA: Insufficient documentation

## 2015-03-06 DIAGNOSIS — G9340 Encephalopathy, unspecified: Secondary | ICD-10-CM | POA: Diagnosis present

## 2015-03-06 DIAGNOSIS — N390 Urinary tract infection, site not specified: Secondary | ICD-10-CM | POA: Diagnosis not present

## 2015-03-06 DIAGNOSIS — N3000 Acute cystitis without hematuria: Secondary | ICD-10-CM | POA: Diagnosis not present

## 2015-03-06 DIAGNOSIS — Z8262 Family history of osteoporosis: Secondary | ICD-10-CM | POA: Diagnosis not present

## 2015-03-06 DIAGNOSIS — J479 Bronchiectasis, uncomplicated: Secondary | ICD-10-CM | POA: Insufficient documentation

## 2015-03-06 DIAGNOSIS — Z1611 Resistance to penicillins: Secondary | ICD-10-CM | POA: Insufficient documentation

## 2015-03-06 DIAGNOSIS — K449 Diaphragmatic hernia without obstruction or gangrene: Secondary | ICD-10-CM | POA: Insufficient documentation

## 2015-03-06 DIAGNOSIS — Z79899 Other long term (current) drug therapy: Secondary | ICD-10-CM | POA: Diagnosis not present

## 2015-03-06 DIAGNOSIS — J9611 Chronic respiratory failure with hypoxia: Secondary | ICD-10-CM | POA: Insufficient documentation

## 2015-03-06 DIAGNOSIS — I10 Essential (primary) hypertension: Secondary | ICD-10-CM | POA: Diagnosis present

## 2015-03-06 DIAGNOSIS — R410 Disorientation, unspecified: Secondary | ICD-10-CM | POA: Diagnosis not present

## 2015-03-06 DIAGNOSIS — Z823 Family history of stroke: Secondary | ICD-10-CM | POA: Insufficient documentation

## 2015-03-06 DIAGNOSIS — Z1639 Resistance to other specified antimicrobial drug: Secondary | ICD-10-CM | POA: Diagnosis not present

## 2015-03-06 DIAGNOSIS — R05 Cough: Secondary | ICD-10-CM | POA: Diagnosis not present

## 2015-03-06 DIAGNOSIS — R339 Retention of urine, unspecified: Secondary | ICD-10-CM | POA: Diagnosis not present

## 2015-03-06 DIAGNOSIS — J189 Pneumonia, unspecified organism: Secondary | ICD-10-CM | POA: Diagnosis not present

## 2015-03-06 DIAGNOSIS — J8410 Pulmonary fibrosis, unspecified: Secondary | ICD-10-CM | POA: Diagnosis not present

## 2015-03-06 HISTORY — DX: Hyperlipidemia, unspecified: E78.5

## 2015-03-06 HISTORY — DX: Essential (primary) hypertension: I10

## 2015-03-06 HISTORY — DX: Unspecified dementia, unspecified severity, without behavioral disturbance, psychotic disturbance, mood disturbance, and anxiety: F03.90

## 2015-03-06 LAB — COMPREHENSIVE METABOLIC PANEL
ALK PHOS: 103 U/L (ref 38–126)
ALT: 22 U/L (ref 14–54)
AST: 28 U/L (ref 15–41)
Albumin: 3.4 g/dL — ABNORMAL LOW (ref 3.5–5.0)
Anion gap: 9 (ref 5–15)
BILIRUBIN TOTAL: 0.5 mg/dL (ref 0.3–1.2)
BUN: 20 mg/dL (ref 6–20)
CHLORIDE: 103 mmol/L (ref 101–111)
CO2: 24 mmol/L (ref 22–32)
Calcium: 8.9 mg/dL (ref 8.9–10.3)
Creatinine, Ser: 1.01 mg/dL — ABNORMAL HIGH (ref 0.44–1.00)
GFR calc Af Amer: >60 mL/min (ref >60)
GFR calc non Af Amer: 52 mL/min — ABNORMAL LOW (ref >60)
GLUCOSE: 178 mg/dL — AB (ref 65–99)
POTASSIUM: 3.9 mmol/L (ref 3.5–5.1)
Sodium: 136 mmol/L (ref 135–145)
Total Protein: 7.4 g/dL (ref 6.5–8.1)

## 2015-03-06 LAB — CBC WITH DIFFERENTIAL/PLATELET
BASOS PCT: 0 %
Basophils Absolute: 0 10*3/uL (ref 0–0.1)
EOS ABS: 0.1 10*3/uL (ref 0–0.7)
EOS PCT: 1 %
HCT: 44.2 % (ref 35.0–47.0)
Hemoglobin: 14.7 g/dL (ref 12.0–16.0)
LYMPHS ABS: 0.8 10*3/uL — AB (ref 1.0–3.6)
LYMPHS PCT: 7 %
MCH: 31 pg (ref 26.0–34.0)
MCHC: 33.3 g/dL (ref 32.0–36.0)
MCV: 93.1 fL (ref 80.0–100.0)
Monocytes Absolute: 0.7 10*3/uL (ref 0.2–0.9)
Monocytes Relative: 6 %
Neutro Abs: 9.4 10*3/uL — ABNORMAL HIGH (ref 1.4–6.5)
Neutrophils Relative %: 86 %
Platelets: 153 10*3/uL (ref 150–440)
RBC: 4.75 MIL/uL (ref 3.80–5.20)
RDW: 14 % (ref 11.5–14.5)
WBC: 11 10*3/uL (ref 3.6–11.0)

## 2015-03-06 LAB — URINALYSIS COMPLETE WITH MICROSCOPIC (ARMC ONLY)
Bilirubin Urine: NEGATIVE
Glucose, UA: NEGATIVE mg/dL
Hgb urine dipstick: NEGATIVE
KETONES UR: NEGATIVE mg/dL
NITRITE: POSITIVE — AB
PROTEIN: NEGATIVE mg/dL
Specific Gravity, Urine: 1.015 (ref 1.005–1.030)
pH: 5 (ref 5.0–8.0)

## 2015-03-06 LAB — LACTIC ACID, PLASMA: LACTIC ACID, VENOUS: 1.3 mmol/L (ref 0.5–2.0)

## 2015-03-06 LAB — TROPONIN I: Troponin I: <0.03 ng/mL (ref <0.031)

## 2015-03-06 MED ORDER — DEXTROSE 5 % IV SOLN
1.0000 g | Freq: Once | INTRAVENOUS | Status: AC
  Administered 2015-03-07: 1 g via INTRAVENOUS
  Filled 2015-03-06 (×2): qty 10

## 2015-03-06 MED ORDER — IOHEXOL 350 MG/ML SOLN
100.0000 mL | Freq: Once | INTRAVENOUS | Status: AC | PRN
  Administered 2015-03-06: 100 mL via INTRAVENOUS

## 2015-03-06 MED ORDER — ACETAMINOPHEN 325 MG PO TABS
650.0000 mg | ORAL_TABLET | Freq: Four times a day (QID) | ORAL | Status: DC | PRN
  Administered 2015-03-08: 650 mg via ORAL
  Filled 2015-03-06: qty 2

## 2015-03-06 MED ORDER — SODIUM CHLORIDE 0.9 % IV SOLN
INTRAVENOUS | Status: AC
  Administered 2015-03-06: via INTRAVENOUS

## 2015-03-06 MED ORDER — SODIUM CHLORIDE 0.9 % IV BOLUS (SEPSIS): 500.0000 mL | Freq: Once | INTRAVENOUS | Status: DC

## 2015-03-06 MED ORDER — DEXTROSE 5 % IV SOLN
1.0000 g | Freq: Once | INTRAVENOUS | Status: AC
  Administered 2015-03-06: 1 g via INTRAVENOUS
  Filled 2015-03-06: qty 10

## 2015-03-06 MED ORDER — OLOPATADINE HCL 0.1 % OP SOLN
1.0000 [drp] | Freq: Two times a day (BID) | OPHTHALMIC | Status: DC
  Administered 2015-03-06 – 2015-03-07 (×3): 1 [drp] via OPHTHALMIC
  Filled 2015-03-06: qty 5

## 2015-03-06 MED ORDER — SODIUM CHLORIDE 0.9 % IV BOLUS (SEPSIS)
1000.0000 mL | Freq: Once | INTRAVENOUS | Status: AC
  Administered 2015-03-06: 1000 mL via INTRAVENOUS

## 2015-03-06 MED ORDER — ENOXAPARIN SODIUM 40 MG/0.4ML ~~LOC~~ SOLN
40.0000 mg | Freq: Every day | SUBCUTANEOUS | Status: DC
  Administered 2015-03-06 – 2015-03-07 (×2): 40 mg via SUBCUTANEOUS
  Filled 2015-03-06 (×2): qty 0.4

## 2015-03-06 MED ORDER — MEMANTINE HCL ER 7 MG PO CP24
28.0000 mg | ORAL_CAPSULE | ORAL | Status: DC
  Administered 2015-03-07 – 2015-03-08 (×2): 28 mg via ORAL
  Filled 2015-03-06 (×3): qty 4

## 2015-03-06 MED ORDER — MIRTAZAPINE 15 MG PO TABS
15.0000 mg | ORAL_TABLET | Freq: Every day | ORAL | Status: DC
  Administered 2015-03-06 – 2015-03-07 (×2): 15 mg via ORAL
  Filled 2015-03-06 (×2): qty 1

## 2015-03-06 MED ORDER — ACETAMINOPHEN 650 MG RE SUPP: 650.0000 mg | Freq: Four times a day (QID) | RECTAL | Status: DC | PRN

## 2015-03-06 MED ORDER — SERTRALINE HCL 25 MG PO TABS
25.0000 mg | ORAL_TABLET | Freq: Every day | ORAL | Status: DC
  Administered 2015-03-07 – 2015-03-08 (×2): 25 mg via ORAL
  Filled 2015-03-06 (×2): qty 1

## 2015-03-06 NOTE — ED Notes (Signed)
MD at bedside. 

## 2015-03-06 NOTE — ED Notes (Signed)
Patient brought in by ems from [facility]. Patient with a history of dementia and had an increase in confusion today. [facility] checked her oxygen saturation and 85% on room air.

## 2015-03-06 NOTE — H&P (Signed)
[facility] [place] [facility]   PATIENT NAME: Jordan [name]    MR#:  [id number]  DATE OF BIRTH:  07/05/1935  DATE OF ADMISSION:  03/06/2015  PRIMARY CARE PHYSICIAN: Tillman Abide, MD   REQUESTING/REFERRING PHYSICIAN: Inocencio Homes, MD  CHIEF COMPLAINT:   Chief Complaint  Patient presents with  . Shortness of Breath    HISTORY OF PRESENT ILLNESS:  Jordan [name]  is a 80 y.o. female who presents with increased confusion. Patient is a resident a local skilled nursing facility, and staff noted her to be more confused. She has baseline dementia, though her symptoms were worse today. Family is present with her in the ED and states that they were also told that she had a fever. In the ED evaluation elicited a UA positive for nitrites indicating UTI as a likely cause of her symptoms. Given IV Rocephin in the ED, and hospitalists called for admission for UTI and acute encephalopathy. Patient's vital signs are otherwise stable, though her blood pressure is borderline low.  PAST MEDICAL HISTORY:   Past Medical History  Diagnosis Date  . UTI (lower urinary tract infection)   . Pneumonia   . Dementia   . Hypertension   . Hyperlipemia     PAST SURGICAL HISTORY:   Past Surgical History  Procedure Laterality Date  . Cataract extraction    . Abdominal hysterectomy      SOCIAL HISTORY:   History  Substance Use Topics  . Smoking status: Former Smoker -- 0.10 packs/day for 20 years    Types: Cigarettes    Quit date: 12/13/1974  . Smokeless tobacco: Never Used     Comment: former some day social smoker per son  . Alcohol Use: No    FAMILY HISTORY:   Family History  Problem Relation Age of Onset  . Cancer Sister     breast  . Stroke Father   . CAD Father   . Osteoporosis Mother     DRUG ALLERGIES:  No Known Allergies  MEDICATIONS AT HOME:   Prior to Admission medications   Medication Sig Start Date End Date Taking? Authorizing Provider   acetaminophen (TYLENOL) 500 MG tablet Take 500 mg by mouth every 6 (six) hours as needed.   Yes Historical Provider, MD  alum & mag hydroxide-simeth (MAALOX PLUS) 400-400-40 MG/5ML suspension Take 5 mLs by mouth every 6 (six) hours as needed for indigestion.   Yes Historical Provider, MD  cetirizine (ZYRTEC) 10 MG tablet Take 10 mg by mouth daily.   Yes Historical Provider, MD  guaiFENesin (ROBITUSSIN) 100 MG/5ML liquid Take 200 mg by mouth 4 (four) times daily as needed for cough.   Yes Historical Provider, MD  loperamide (IMODIUM) 2 MG capsule Take 2 mg by mouth every 3 (three) hours as needed for diarrhea or loose stools.   Yes Historical Provider, MD  memantine (NAMENDA XR) 28 MG CP24 24 hr capsule Take 28 mg by mouth every morning.    Yes Historical Provider, MD  mirtazapine (REMERON) 15 MG tablet Take 15 mg by mouth at bedtime.   Yes Historical Provider, MD  Olopatadine HCl (PATADAY) 0.2 % SOLN Place 1 drop into both eyes every morning.   Yes Historical Provider, MD  sertraline (ZOLOFT) 25 MG tablet Take 25 mg by mouth daily.   Yes Historical Provider, MD  vitamin B-12 (CYANOCOBALAMIN) 1000 MCG tablet Take 1,000 mcg by mouth daily.   Yes Historical Provider, MD  guaiFENesin (MUCINEX) 600 MG 12 hr  tablet Take 600 mg by mouth 2 (two) times daily.    Historical Provider, MD  loratadine (CLARITIN) 10 MG tablet Take 10 mg by mouth daily.    Historical Provider, MD  magnesium hydroxide (MILK OF MAGNESIA) 400 MG/5ML suspension Take 30 mLs by mouth at bedtime as needed for mild constipation.    Historical Provider, MD    REVIEW OF SYSTEMS:  Review of Systems  Constitutional: Positive for fever (as reported by nursing facility). Negative for chills, weight loss and malaise/fatigue.  HENT: Negative for ear pain, hearing loss and tinnitus.   Eyes: Negative for blurred vision, double vision, pain and redness.  Respiratory: Negative for cough, hemoptysis and shortness of breath.   Cardiovascular:  Negative for chest pain, palpitations, orthopnea and leg swelling.  Gastrointestinal: Negative for nausea, vomiting, abdominal pain, diarrhea and constipation.  Genitourinary: Negative for dysuria, frequency and hematuria.  Musculoskeletal: Negative for back pain, joint pain and neck pain.  Skin:       No acne, rash, or lesions  Neurological: Negative for dizziness, tremors, focal weakness and weakness.       Increased confusion reported by nursing facility  Endo/Heme/Allergies: Negative for polydipsia. Does not bruise/bleed easily.  Psychiatric/Behavioral: Negative for depression. The patient is not nervous/anxious and does not have insomnia.     Reliability of review of systems is questionable given the patient's baseline dementia. She does answer questions and follow commands. VITAL SIGNS:   Filed Vitals:   03/06/15 1917 03/06/15 2000 03/06/15 2030  BP: 146/73 108/77 101/65  Pulse: 88 84 86  Temp: 98.2 F (36.8 C)    TempSrc: Oral    Resp: 22 26 18   Height: 5\' 6"  (1.676 m)    Weight: 72.576 kg (160 lb)    SpO2: 90% 97% 94%   Wt Readings from Last 3 Encounters:  03/06/15 72.576 kg (160 lb)  01/02/15 66.225 kg (146 lb)  12/13/14 67.586 kg (149 lb)    PHYSICAL EXAMINATION:  Physical Exam  Constitutional: She appears well-developed and well-nourished. No distress.  HENT:  Head: Normocephalic and atraumatic.  Mouth/Throat: Oropharynx is clear and moist.  Eyes: Conjunctivae and EOM are normal. Pupils are equal, round, and reactive to light. No scleral icterus.  Neck: Normal range of motion. Neck supple. No JVD present. No thyromegaly present.  Cardiovascular: Normal rate, regular rhythm and intact distal pulses.  Exam reveals no gallop and no friction rub.   Murmur (2/6 systolic murmur) heard. Respiratory: Effort normal and breath sounds normal. No respiratory distress. She has no wheezes. She has no rales.  GI: Soft. Bowel sounds are normal. She exhibits no distension. There  is no tenderness.  Musculoskeletal: Normal range of motion. She exhibits no edema.  No arthritis, no gout  Lymphadenopathy:    She has no cervical adenopathy.  Neurological: She is alert. No cranial nerve deficit.  No dysarthria, no aphasia  Skin: Skin is warm and dry. No rash noted. No erythema.  Psychiatric:  Unable to assess due to baseline dementia    LABORATORY PANEL:   CBC  Recent Labs Lab 03/06/15 1937  WBC 11.0  HGB 14.7  HCT 44.2  PLT 153   ------------------------------------------------------------------------------------------------------------------  Chemistries   Recent Labs Lab 03/06/15 1937  NA 136  K 3.9  CL 103  CO2 24  GLUCOSE 178*  BUN 20  CREATININE 1.01*  CALCIUM 8.9  AST 28  ALT 22  ALKPHOS 103  BILITOT 0.5   ------------------------------------------------------------------------------------------------------------------  Cardiac Enzymes  Recent  Labs Lab 03/06/15 1937  TROPONINI <0.03   ------------------------------------------------------------------------------------------------------------------  RADIOLOGY:  Dg Chest 2 View  03/06/2015   CLINICAL DATA:  Patient with dementia and increased confusion.  EXAM: CHEST  2 VIEW  COMPARISON:  Chest radiograph 12/13/2014  FINDINGS: Stable cardiac and mediastinal contours. Large hiatal hernia. Low lung volumes. Bibasilar coarse interstitial opacities. Slight interval improvement in previously described right upper lobe density. Likely chronic thickening of the right costophrenic angle. No large effusion identified. Osseous demineralization with mid and lower thoracic spine degenerative changes.  IMPRESSION: Slight interval improvement in previously described right upper lobe likely subpleural process is nonspecific however may represent a resolving infectious process. However, given that this abnormality has persisted for multiple months, further evaluation with chest CT is warranted.    Electronically Signed   By: Annia Belt M.D.   On: 03/06/2015 20:04   Ct Angio Chest Pe W/cm &/or Wo Cm  03/06/2015   CLINICAL DATA:  Shortness of breath. Hypoxia. Increased confusion today.  EXAM: CT ANGIOGRAPHY CHEST WITH CONTRAST  TECHNIQUE: Multidetector CT imaging of the chest was performed using the standard protocol during bolus administration of intravenous contrast. Multiplanar CT image reconstructions and MIPs were obtained to evaluate the vascular anatomy.  CONTRAST:  OMNIPAQUE IOHEXOL 350 MG/ML SOLN  COMPARISON:  None.  FINDINGS: Mediastinum/Lymph Nodes: Satisfactory opacification of pulmonary arteries is noted, and no pulmonary embolism identified. No evidence of thoracic aortic dissection or aneurysm. No masses or pathologically enlarged lymph nodes identified. Large hiatal hernia is seen containing stomach and pancreas.  Lungs/Pleura: No pulmonary infiltrate or mass identified. No effusion present. Subpleural fibrosis and mild traction bronchiectasis seen in the peripheral lower lobes bilaterally. Compressive atelectasis is seen in the left lower lobe secondary to large hiatal hernia.  Musculoskeletal/Soft Tissues: No suspicious bone lesions or other significant chest wall abnormality.  Upper Abdomen:  Unremarkable.  Review of the MIP images confirms the above findings.  IMPRESSION: No evidence of pulmonary embolism or other acute findings.  Large hiatal hernia with left lower lobe compressive atelectasis.  Bilateral lower lobe subpleural fibrosis with mild traction bronchiectasis.   Electronically Signed   By: Myles Rosenthal M.D.   On: 03/06/2015 21:35    EKG:   Orders placed or performed during the hospital encounter of 03/06/15  . ED EKG  . ED EKG    IMPRESSION AND PLAN:  Principal Problem:   UTI (lower urinary tract infection) - IV Rocephin given in the ED, we'll continue this antibiotic on admission for now, follow-up urine culture for susceptibilities. Active Problems:   Acute  encephalopathy - likely due to her UTI. Expect to see improvement over the next 24-48 hours with appropriate treatment of the underlying condition.   Dementia - continue home medications for this   HTN (hypertension) - has a history of hypertension listed in her chart, though her blood pressure is borderline low at this time, and she does not seem to be taking any antihypertensives at the nursing facility where she resides. We'll monitor her blood pressure closely.  All the records are reviewed and case discussed with ED provider. Management plans discussed with the patient and/or family.  DVT PROPHYLAXIS: SubQ lovenox  ADMISSION STATUS: Observation  CODE STATUS: DO NOT RESUSCITATE  TOTAL TIME TAKING CARE OF THIS PATIENT: 30 minutes.    Jordan [name] FIELDING 03/06/2015, 10:44 PM  [facility]  Office  [phone]  CC: Primary care physician; Tillman Abide, MD

## 2015-03-06 NOTE — ED Provider Notes (Signed)
[facility] Emergency Department Provider Note  ____________________________________________  Time seen: Approximately 7:32 PM  I have reviewed the triage vital signs and the nursing notes.   HISTORY  Chief Complaint Shortness of Breath   Caveat-history of present illness and review of systems limited secondary to the patient's dementia. All HPI and ROS obtained from staff at [facility].  HPI Jordan [name] is a 80 y.o. female with dementia who presents from [facility] with hypoxia. According to staff at [facility], she returned from dinner and appeared anxious and SOB. Staff checked O2 sat and it was 85%, minimally improved with supplemental O2. She has also had cough tonight. No recent infectious symptoms otherwise. No vomiting, diarrhea. Temperature at [place] was 99.3 today but she has not had a documented temperature higher than that.   Past Medical History  Diagnosis Date  . UTI (lower urinary tract infection)   . Pneumonia   . Dementia   . Hypertension   . Hyperlipemia     Patient Active Problem List   Diagnosis Date Noted  . Allergic rhinitis 01/02/2015  . CAP (community acquired pneumonia) 12/13/2014  . Chronic respiratory failure with hypoxemia 12/13/2014  . Shortness of breath 12/13/2014    No past surgical history on file.  Current Outpatient Rx  Name  Route  Sig  Dispense  Refill  . acetaminophen (TYLENOL) 500 MG tablet   Oral   Take 500 mg by mouth every 6 (six) hours as needed.         Marland Kitchen alum & mag hydroxide-simeth (MAALOX PLUS) 400-400-40 MG/5ML suspension   Oral   Take 5 mLs by mouth every 6 (six) hours as needed for indigestion.         . cetirizine (ZYRTEC) 10 MG tablet   Oral   Take 10 mg by mouth daily.         Marland Kitchen guaiFENesin (ROBITUSSIN) 100 MG/5ML liquid   Oral   Take 200 mg by mouth 4 (four) times daily as needed for cough.         . loperamide (IMODIUM) 2 MG capsule   Oral   Take 2 mg by mouth every  3 (three) hours as needed for diarrhea or loose stools.         . memantine (NAMENDA XR) 28 MG CP24 24 hr capsule   Oral   Take 28 mg by mouth every morning.          . mirtazapine (REMERON) 15 MG tablet   Oral   Take 15 mg by mouth at bedtime.         . Olopatadine HCl (PATADAY) 0.2 % SOLN   Both Eyes   Place 1 drop into both eyes every morning.         . sertraline (ZOLOFT) 25 MG tablet   Oral   Take 25 mg by mouth daily.         . vitamin B-12 (CYANOCOBALAMIN) 1000 MCG tablet   Oral   Take 1,000 mcg by mouth daily.         Marland Kitchen guaiFENesin (MUCINEX) 600 MG 12 hr tablet   Oral   Take 600 mg by mouth 2 (two) times daily.         Marland Kitchen loratadine (CLARITIN) 10 MG tablet   Oral   Take 10 mg by mouth daily.         . magnesium hydroxide (MILK OF MAGNESIA) 400 MG/5ML suspension   Oral  Take 30 mLs by mouth at bedtime as needed for mild constipation.           Allergies Review of patient's allergies indicates no known allergies.  Family History  Problem Relation Age of Onset  . Cancer Sister     breast    Social History History  Substance Use Topics  . Smoking status: Former Smoker -- 0.10 packs/day for 20 years    Types: Cigarettes    Quit date: 12/13/1974  . Smokeless tobacco: Never Used     Comment: former some day social smoker per son  . Alcohol Use: No    Review of Systems  Respiratory: + shortness of breath. Gastrointestinal:  no vomiting.  No diarrhea.     Caveat-history of present illness and review of systems limited secondary to the patient's dementia. All HPI and ROS obtained from staff at [facility]. ____________________________________________   PHYSICAL EXAM:  VITAL SIGNS: ED Triage Vitals  Enc Vitals Group     BP 03/06/15 1917 146/73 mmHg     Pulse Rate 03/06/15 1917 88     Resp 03/06/15 1917 22     Temp 03/06/15 1917 98.2 F (36.8 C)     Temp Source 03/06/15 1917 Oral     SpO2 03/06/15 1917 90 %     Weight 03/06/15  1917 160 lb (72.576 kg)     Height 03/06/15 1917 5\' 6"  (1.676 m)     Head Cir --      Peak Flow --      Pain Score --      Pain Loc --      Pain Edu? --      Excl. in GC? --     Constitutional: Alert and oriented to self and place but not to year. Pleasantly demented, becomes quite short of breath with minimal exertion. Eyes: Conjunctivae are normal. PERRL. EOMI. Head: Atraumatic. Nose: No congestion/rhinnorhea. Mouth/Throat: Mucous membranes are moist.  Oropharynx non-erythematous. Neck: No stridor.  Cardiovascular: Normal rate, regular rhythm. Grossly normal heart sounds.  Good peripheral circulation. Respiratory: Mildly tachypnea, the patient becomes quite short of breath with even sitting forward for the examination, she has diminished breath sounds at bilateral bases. Gastrointestinal: Soft and nontender. No distention. No abdominal bruits. No CVA tenderness. Genitourinary: deferred Musculoskeletal: No lower extremity tenderness nor edema.  No joint effusions. Neurologic:  Normal speech and language. No gross focal neurologic deficits are appreciated.  Skin:  Skin is warm, dry and intact. No rash noted. Psychiatric: Mood and affect are normal. Speech and behavior are normal.  ____________________________________________   LABS (all labs ordered are listed, but only abnormal results are displayed)  Labs Reviewed  CBC WITH DIFFERENTIAL/PLATELET - Abnormal; Notable for the following:    Neutro Abs 9.4 (*)    Lymphs Abs 0.8 (*)    All other components within normal limits  COMPREHENSIVE METABOLIC PANEL - Abnormal; Notable for the following:    Glucose, Bld 178 (*)    Creatinine, Ser 1.01 (*)    Albumin 3.4 (*)    GFR calc non Af Amer 52 (*)    All other components within normal limits  URINALYSIS COMPLETEWITH MICROSCOPIC ([facility] ONLY) - Abnormal; Notable for the following:    Color, Urine YELLOW (*)    APPearance HAZY (*)    Nitrite POSITIVE (*)    Leukocytes, UA 2+ (*)     Bacteria, UA RARE (*)    Squamous Epithelial / LPF 0-5 (*)  All other components within normal limits  CULTURE, BLOOD (ROUTINE X 2)  CULTURE, BLOOD (ROUTINE X 2)  URINE CULTURE  TROPONIN I  LACTIC ACID, PLASMA  LACTIC ACID, PLASMA   ____________________________________________  EKG  ED ECG REPORT I, Gayla Doss, the attending physician, personally viewed and interpreted this ECG.   Date: 03/06/2015  EKG Time: 19:58  Rate: 77  Rhythm: normal sinus rhythm  Axis: normal  Intervals:none  ST&T Change: No acute ST segment elevation  ____________________________________________  RADIOLOGY  CXR  IMPRESSION: Slight interval improvement in previously described right upper lobe likely subpleural process is nonspecific however may represent a resolving infectious process. However, given that this abnormality has persisted for multiple months, further evaluation with chest CT is warranted. ____________________________________________   PROCEDURES  Procedure(s) performed: None  Critical Care performed: No  ____________________________________________   INITIAL IMPRESSION / ASSESSMENT AND PLAN / ED COURSE  Pertinent labs & imaging results that were available during my care of the patient were reviewed by me and considered in my medical decision making (see chart for details).  Jordan [name] is a 80 y.o. female with dementia who presents from [facility] with hypoxia and confusion increased from baseline. On exam, she is pleasantly demented. She does not appear to be in any acute distress however with minimal exertion, she becomes quite tachypnea. She has dizziness breath sounds at bilateral bases. She is requiring oxygen to maintain O2 sat greater than 94% and she does not have a chronic home oxygen requirement. Plan for labs, chest x-ray. Reassess for need for advanced imaging.  ----------------------------------------- 10:07 PM on  03/06/2015 -----------------------------------------  CTA chest negative for PE. She has bronchiectasis as well as mild compression atelectasis related to hiatal hernia. No pneumonia. Urinalysis positive for nitrite positive urinary tract infection which certainly could be causing her increased confusion. We'll give ceftriaxone. Discussed with hospitalist  for admission.  ____________________________________________   FINAL CLINICAL IMPRESSION(S) / ED DIAGNOSES  Final diagnoses:  SOB (shortness of breath)  Hypoxia  UTI (lower urinary tract infection)  Confusion      Gayla Doss, MD 03/07/15 [phone]

## 2015-03-07 ENCOUNTER — Telehealth: Payer: Self-pay

## 2015-03-07 DIAGNOSIS — N3000 Acute cystitis without hematuria: Secondary | ICD-10-CM | POA: Diagnosis not present

## 2015-03-07 DIAGNOSIS — G9340 Encephalopathy, unspecified: Secondary | ICD-10-CM | POA: Diagnosis not present

## 2015-03-07 DIAGNOSIS — I10 Essential (primary) hypertension: Secondary | ICD-10-CM | POA: Diagnosis not present

## 2015-03-07 DIAGNOSIS — N390 Urinary tract infection, site not specified: Secondary | ICD-10-CM | POA: Diagnosis not present

## 2015-03-07 DIAGNOSIS — F0390 Unspecified dementia without behavioral disturbance: Secondary | ICD-10-CM | POA: Diagnosis not present

## 2015-03-07 LAB — BASIC METABOLIC PANEL
ANION GAP: 5 (ref 5–15)
BUN: 17 mg/dL (ref 6–20)
CHLORIDE: 111 mmol/L (ref 101–111)
CO2: 25 mmol/L (ref 22–32)
Calcium: 8.5 mg/dL — ABNORMAL LOW (ref 8.9–10.3)
Creatinine, Ser: 0.86 mg/dL (ref 0.44–1.00)
GFR calc Af Amer: >60 mL/min (ref >60)
GFR calc non Af Amer: >60 mL/min (ref >60)
GLUCOSE: 98 mg/dL (ref 65–99)
Potassium: 3.8 mmol/L (ref 3.5–5.1)
Sodium: 141 mmol/L (ref 135–145)

## 2015-03-07 LAB — MRSA PCR SCREENING: MRSA by PCR: NEGATIVE

## 2015-03-07 MED ORDER — HALOPERIDOL LACTATE 5 MG/ML IJ SOLN: 1.0000 mg | Freq: Four times a day (QID) | INTRAMUSCULAR | Status: DC | PRN

## 2015-03-07 NOTE — Progress Notes (Signed)
[facility]   PATIENT NAME: Jordan [name]    MR#:  [id number]  DATE OF BIRTH:  02/03/1935  SUBJECTIVE:  CHIEF COMPLAINT:   Chief Complaint  Patient presents with  . Shortness of Breath   Doing great this morning. Sitting up eating lunch. Daughter at the bedside. Patient is alert oriented to person and place  REVIEW OF SYSTEMS:   Review of Systems  Constitutional: Negative for fever.  Respiratory: Negative for shortness of breath.   Cardiovascular: Negative for chest pain and palpitations.  Gastrointestinal: Negative for nausea, vomiting and abdominal pain.  Genitourinary: Negative for dysuria.    DRUG ALLERGIES:  No Known Allergies  VITALS:  Blood pressure 90/69, pulse 50, temperature 97.5 F (36.4 C), temperature source Oral, resp. rate 18, height 5\' 6"  (1.676 m), weight 71.668 kg (158 lb), SpO2 99 %.  PHYSICAL EXAMINATION:  GENERAL:  80 y.o.-year-old patient sitting up eating breakfast LUNGS: Lungs clear to auscultation bilaterally good air movement no rhonchi rales or wheezes CARDIOVASCULAR: S1, S2 normal. No murmurs, rubs, or gallops.  ABDOMEN: Soft, nontender, nondistended. Bowel sounds present. No organomegaly or mass.  EXTREMITIES: No pedal edema, cyanosis, or clubbing.  NEUROLOGIC: Cranial nerves II through XII are intact. Muscle strength 5/5 in all extremities. Sensation intact. Gait not checked.  PSYCHIATRIC: The patient is alert and oriented x 3.  SKIN: No obvious rash, lesion, or ulcer.    LABORATORY PANEL:   CBC  Recent Labs Lab 03/06/15 1937  WBC 11.0  HGB 14.7  HCT 44.2  PLT 153   ------------------------------------------------------------------------------------------------------------------  Chemistries   Recent Labs Lab 03/06/15 1937 03/07/15 0545  NA 136 141  K 3.9 3.8  CL 103 111  CO2 24 25  GLUCOSE 178* 98  BUN 20 17  CREATININE 1.01* 0.86  CALCIUM 8.9 8.5*  AST 28  --   ALT  22  --   ALKPHOS 103  --   BILITOT 0.5  --    ------------------------------------------------------------------------------------------------------------------  Cardiac Enzymes  Recent Labs Lab 03/06/15 1937  TROPONINI <0.03   ------------------------------------------------------------------------------------------------------------------  RADIOLOGY:  Dg Chest 2 View  03/06/2015   CLINICAL DATA:  Patient with dementia and increased confusion.  EXAM: CHEST  2 VIEW  COMPARISON:  Chest radiograph 12/13/2014  FINDINGS: Stable cardiac and mediastinal contours. Large hiatal hernia. Low lung volumes. Bibasilar coarse interstitial opacities. Slight interval improvement in previously described right upper lobe density. Likely chronic thickening of the right costophrenic angle. No large effusion identified. Osseous demineralization with mid and lower thoracic spine degenerative changes.  IMPRESSION: Slight interval improvement in previously described right upper lobe likely subpleural process is nonspecific however may represent a resolving infectious process. However, given that this abnormality has persisted for multiple months, further evaluation with chest CT is warranted.   Electronically Signed   By: Drew  Davis M.D.   On: 03/06/2015 20:04   Ct Angio Chest Pe W/cm &/or Wo Cm  03/06/2015   CLINICAL DATA:  Shortness of breath. Hypoxia. Increased confusion today.  EXAM: CT ANGIOGRAPHY CHEST WITH CONTRAST  TECHNIQUE: Multidetector CT imaging of the chest was performed using the standard protocol during bolus administration of intravenous contrast. Multiplanar CT image reconstructions and MIPs were obtained to evaluate the vascular anatomy.  CONTRAST:  Arlyss RHill y Arlyss RHill y Arlyss RHill y Arlyss RHill y Arlyss RHill y Arlyss RHillery [id number] IOHEXOL 350 MG/ML SOLN  COMPARISON:  None.  FINDINGS: Mediastinum/Lymph Nodes: Satisfactory opacification of pulmonary arteries is noted, and no pulmonary embolism identified. No evidence of thoracic aortic dissection or aneurysm. No  masses  or pathologically enlarged lymph nodes identified. Large hiatal hernia is seen containing stomach and pancreas.  Lungs/Pleura: No pulmonary infiltrate or mass identified. No effusion present. Subpleural fibrosis and mild traction bronchiectasis seen in the peripheral lower lobes bilaterally. Compressive atelectasis is seen in the left lower lobe secondary to large hiatal hernia.  Musculoskeletal/Soft Tissues: No suspicious bone lesions or other significant chest wall abnormality.  Upper Abdomen:  Unremarkable.  Review of the MIP images confirms the above findings.  IMPRESSION: No evidence of pulmonary embolism or other acute findings.  Large hiatal hernia with left lower lobe compressive atelectasis.  Bilateral lower lobe subpleural fibrosis with mild traction bronchiectasis.   Electronically Signed   By: Myles Rosenthal M.D.   On: 03/06/2015 21:35    EKG:   Orders placed or performed during the hospital encounter of 03/06/15  . ED EKG  . ED EKG    ASSESSMENT AND PLAN:   #1 urinary tract infection: Continue with Rocephin. Urine culture sent. Clinically improving. Blood cultures negative so far  #2 metabolic encephalopathy due to infection: Much improved, per her daughter the patient is at her current baseline  3 dementia: Stable, continue home medications no behavioral disturbance  4 hypertension: Blood pressure normal at this time.  CODE STATUS: DO NOT RESUSCITATE  TOTAL TIME TAKING CARE OF THIS PATIENT: 25 minutes.  Greater than 50% of time spent in care coordination and counseling. Spoke with the patient and her daughter at the bedside. Case management discussed with nursing. POSSIBLE D/C IN 1-2 DAYS, DEPENDING ON CLINICAL CONDITION.   Elby Showers M.D on 03/07/2015 at 2:43 PM  Between 7am to 6pm - Pager - [phone]  After 6pm go to [email or web address] - password [email or web address]  [facility]  Office  [phone]  CC: Primary care physician; Tillman Abide, MD

## 2015-03-07 NOTE — Progress Notes (Signed)
RN notified Dr Clent Ridges that patient is becoming increasingly restless and agitated when kept from getting up, patient believes she is not in her room and tries to get up.  Patient also refuses help to walk so RN is concern of patient falling.  MD states "ok, I will see and put in orders"   RN also notified son, son will be here as soon as he can.

## 2015-03-07 NOTE — Progress Notes (Addendum)
Sent OBS letter to 1A. Spoke with patient's son over the phone and explained. Copy left in room for son. Patient confused.  [phone])

## 2015-03-07 NOTE — Evaluation (Signed)
Physical Therapy Evaluation Patient Details Name: Jordan [name] MRN: [id number] DOB: 10-19-1934 Today's Date: 03/07/2015   History of Present Illness  Pt is a 80 year-old female admitted to the hospital for increased confusion and UTI. She comes from a long term care facility   Clinical Impression  Pt presents with hx of UTI, pneumonia, dementia, HTN, and hyperlipemia. Examination reveals that she performs bed mobility with min assist, transfers with CGA, and ambulation with CGA. Her primary deficits are secondary to her cardiopulmonary status. She shows good general strength and balance, but decreased activity tolerance during ambulation (reference vitals in ambulation section). Pt will benefit from skilled PT to address endurance deficits in order to safely return to long term care. This entire session was guided, instructed, and directly supervised by Elizabeth Palau, DPT.     Follow Up Recommendations  (Return back to long term care)    Equipment Recommendations   (TBD)    Recommendations for Other Services       Precautions / Restrictions Precautions Precautions: Fall Restrictions Weight Bearing Restrictions: No      Mobility  Bed Mobility Overal bed mobility: Needs Assistance Bed Mobility: Supine to Sit     Supine to sit: Min assist     General bed mobility comments: needs assist for getting trunk up. She needs cues for pushing with her hands to get to EOB  Transfers Overall transfer level: Needs assistance Equipment used: None (Tried RW, performs better with nothing) Transfers: Sit to/from Stand Sit to Stand: Min guard         General transfer comment: transfers with CGA for safety within the hospital. Demonstrates good functional strength rising into standing and lowering into sitting. Needs cues for hand placement  Ambulation/Gait Ambulation/Gait assistance: Min guard Ambulation Distance (Feet): 90 Feet Assistive device: None (Performs better without  RW) Gait Pattern/deviations: Step-through pattern Gait velocity: decreased   General Gait Details: Pt has generally WFL gait. On room air resting her O2 sat is 92%. During ambulation her O2 sat drops to 88% on room air. Shortly after ambulation or O2 sat is 92%. Pt needs cues during ambulation on where to go and how to use RW (didn't use it long). Her gait is smother and faster without assistive device.   Stairs            Wheelchair Mobility    Modified Rankin (Stroke Patients Only)       Balance Overall balance assessment: No apparent balance deficits (not formally assessed)                                           Pertinent Vitals/Pain Pain Assessment: No/denies pain    Home Living Family/patient expects to be discharged to:: Skilled nursing facility                      Prior Function Level of Independence: Needs assistance         Comments: Pt hx not reliable     Hand Dominance        Extremity/Trunk Assessment   Upper Extremity Assessment: Overall WFL for tasks assessed           Lower Extremity Assessment: Overall WFL for tasks assessed         Communication   Communication: Expressive difficulties (cognitively impaired)  Cognition Arousal/Alertness: Awake/alert Behavior During Therapy: [facility]  for tasks assessed/performed (cognitive impairments. Confusion) Overall Cognitive Status: No family/caregiver present to determine baseline cognitive functioning                      General Comments      Exercises        Assessment/Plan    PT Assessment Patient needs continued PT services  PT Diagnosis  (None)   PT Problem List Cardiopulmonary status limiting activity  PT Treatment Interventions DME instruction;Gait training;Stair training;Functional mobility training;Therapeutic activities;Therapeutic exercise;Balance training;Neuromuscular re-education;Patient/family education;Cognitive  remediation;Wheelchair mobility training   PT Goals (Current goals can be found in the Care Plan section)      Frequency Min 2X/week   Barriers to discharge        Co-evaluation               End of Session Equipment Utilized During Treatment: Gait belt Activity Tolerance: Patient tolerated treatment well Patient left: in chair;with call bell/phone within reach;with chair alarm set (nursing notified of being in chair given cognition ) Nurse Communication: Mobility status         Time: 4540-9811 PT Time Calculation (min) (ACUTE ONLY): 30 min   Charges:         PT G CodesBenna Dunks 2015/04/05, 4:48 PM  Benna Dunks, SPT. [phone]

## 2015-03-07 NOTE — Progress Notes (Signed)
Patient remains confused.  No complaint today other than getting agitated today and getting out of bed, MD aware.  Patient currently resting calmly with family at bedside.  Bed alarm in place.

## 2015-03-07 NOTE — Progress Notes (Signed)
   03/07/15 1240  Clinical Encounter Type  Visited With Patient and family together  Visit Type Initial  Consult/Referral To Chaplain  Spiritual Encounters  Spiritual Needs Emotional  Stress Factors  Patient Stress Factors None identified  Family Stress Factors Health changes  Chaplain rounded in unit and offered a compassionante presence, spiritual support and prayer to patient and family. Patient is a International aid/development worker.  Theme park manager A. Keyante Durio Ext. [phone]

## 2015-03-07 NOTE — Evaluation (Signed)
Physical Therapy Evaluation Patient Details Name: Jordan TEGETHOFF MRN: [id number] DOB: 1935-03-21 Today's Date: 03/07/2015   History of Present Illness  Pt is a 80 year-old female admitted to the hospital for increased confusion and UTI. She comes from a long term care facility   Clinical Impression  Pt presents with hx of UTI, pneumonia, dementia, HTN, and hyperlipemia. Examination reveals that she performs bed mobility with min assist, transfers with CGA, and ambulation with CGA. Her primary deficits are secondary to her cardiopulmonary status. She shows good general strength and balance, but decreased activity tolerance during ambulation (reference vitals in ambulation section). Pt will benefit from skilled PT to address endurance deficits in order to safely return to long term care. This entire session was guided, instructed, and directly supervised by Elizabeth Palau, DPT.     Follow Up Recommendations  (Return back to long term care)    Equipment Recommendations   (TBD)    Recommendations for Other Services       Precautions / Restrictions Precautions Precautions: Fall Restrictions Weight Bearing Restrictions: No      Mobility  Bed Mobility Overal bed mobility: Needs Assistance Bed Mobility: Supine to Sit     Supine to sit: Min assist     General bed mobility comments: needs assist for getting trunk up. She needs cues for pushing with her hands to get to EOB  Transfers Overall transfer level: Needs assistance Equipment used: None (Tried RW, performs better with nothing) Transfers: Sit to/from Stand Sit to Stand: Min guard         General transfer comment: transfers with CGA for safety within the hospital. Demonstrates good functional strength rising into standing and lowering into sitting. Needs cues for hand placement  Ambulation/Gait Ambulation/Gait assistance: Min guard Ambulation Distance (Feet): 90 Feet Assistive device: None (Performs better without  RW) Gait Pattern/deviations: Step-through pattern Gait velocity: decreased   General Gait Details: Pt has generally WFL gait. On room air resting her O2 sat is 92%. During ambulation her O2 sat drops to 88% on room air. Shortly after ambulation or O2 sat is 92%. Pt needs cues during ambulation on where to go and how to use RW. Two trials of ambulation were performed to assess gait with and without walker. Her gait is smother and faster without assistive device. Assessment of assistive device with ambulation and ambulation without assistive device took 11 minutes to complete.    Stairs            Wheelchair Mobility    Modified Rankin (Stroke Patients Only)       Balance Overall balance assessment: No apparent balance deficits (not formally assessed)                                           Pertinent Vitals/Pain Pain Assessment: No/denies pain    Home Living Family/patient expects to be discharged to:: Skilled nursing facility                      Prior Function Level of Independence: Needs assistance         Comments: Pt hx not reliable     Hand Dominance        Extremity/Trunk Assessment   Upper Extremity Assessment: Overall WFL for tasks assessed           Lower Extremity Assessment: Overall WFL for  tasks assessed         Communication   Communication: Expressive difficulties (cognitively impaired)  Cognition Arousal/Alertness: Awake/alert Behavior During Therapy: WFL for tasks assessed/performed (cognitive impairments. Confusion) Overall Cognitive Status: No family/caregiver present to determine baseline cognitive functioning                      General Comments      Exercises        Assessment/Plan    PT Assessment Patient needs continued PT services  PT Diagnosis  (None)   PT Problem List Cardiopulmonary status limiting activity  PT Treatment Interventions DME instruction;Gait training;Stair  training;Functional mobility training;Therapeutic activities;Therapeutic exercise;Balance training;Neuromuscular re-education;Patient/family education;Cognitive remediation;Wheelchair mobility training   PT Goals (Current goals can be found in the Care Plan section)      Frequency Min 2X/week   Barriers to discharge        Co-evaluation               End of Session Equipment Utilized During Treatment: Gait belt Activity Tolerance: Patient tolerated treatment well Patient left: in chair;with call bell/phone within reach;with chair alarm set (nursing notified of being in chair given cognition ) Nurse Communication: Mobility status    Functional Assessment Tool Used: clinical judgement Functional Limitation: Mobility: Walking and moving around Mobility: Walking and Moving Around Current Status (Z6109): At least 1 percent but less than 20 percent impaired, limited or restricted Mobility: Walking and Moving Around Goal Status [phone]): 0 percent impaired, limited or restricted    Time: 0981-1914 PT Time Calculation (min) (ACUTE ONLY): 30 min   Charges:   PT Evaluation $Initial PT Evaluation Tier I: 1 Procedure PT Treatments $Gait Training: 8-22 mins   PT G Codes:   PT G-Codes **NOT FOR INPATIENT CLASS** Functional Assessment Tool Used: clinical judgement Functional Limitation: Mobility: Walking and moving around Mobility: Walking and Moving Around Current Status (N8295): At least 1 percent but less than 20 percent impaired, limited or restricted Mobility: Walking and Moving Around Goal Status [phone]): 0 percent impaired, limited or restricted    [facility] 03/07/2015, 4:55 PM  .cmspt

## 2015-03-07 NOTE — Telephone Encounter (Addendum)
Appears from [facility] ED note pt was admitted 03/06/15. The two notes from team health have same DOB but one is listed as female [place]) and the other listed as female ([place]).

## 2015-03-07 NOTE — Telephone Encounter (Signed)
Was admitted Spoke to son to review situation

## 2015-03-07 NOTE — Evaluation (Signed)
Physical Therapy Evaluation Patient Details Name: Jordan [name] MRN: [id number] DOB: [birth year] Today's Date: 03/07/2015   History of Present Illness  Pt is a 80 year-old female admitted to the hospital for increased confusion and UTI. She comes from a long term care facility   Clinical Impression  Pt presents with hx of UTI, pneumonia, dementia, HTN, and hyperlipemia. Examination reveals that she performs bed mobility with min assist, transfers with CGA, and ambulation with CGA. Her primary deficits are secondary to her cardiopulmonary status. She shows good general strength and balance. Therefore she will be d/c'd in house due to her not having PT needs currently. Should a future need arise, please order PT again.     Follow Up Recommendations No PT follow up    Equipment Recommendations   (TBD)    Recommendations for Other Services       Precautions / Restrictions Precautions Precautions: Fall Restrictions Weight Bearing Restrictions: No      Mobility  Bed Mobility Overal bed mobility: Needs Assistance Bed Mobility: Supine to Sit     Supine to sit: Min assist     General bed mobility comments: needs assist for getting trunk up. She needs cues for pushing with her hands to get to EOB  Transfers Overall transfer level: Needs assistance Equipment used: None (Tried RW, performs better with nothing) Transfers: Sit to/from Stand Sit to Stand: Min guard         General transfer comment: transfers with CGA for safety within the hospital. Demonstrates good functional strength rising into standing and lowering into sitting. Needs cues for hand placement  Ambulation/Gait Ambulation/Gait assistance: Min guard Ambulation Distance (Feet): 90 Feet Assistive device: None (Performs better without RW) Gait Pattern/deviations: Step-through pattern Gait velocity: decreased   General Gait Details: Pt has generally WFL gait. On room air resting her O2 sat is 92%. During ambulation  her O2 sat drops to 88% on room air. Shortly after ambulation or O2 sat is 92%. Pt needs cues during ambulation on where to go and how to use RW (didn't use it long). Her gait is smother and faster without assistive device.   Stairs            Wheelchair Mobility    Modified Rankin (Stroke Patients Only)       Balance Overall balance assessment: No apparent balance deficits (not formally assessed)                                           Pertinent Vitals/Pain Pain Assessment: No/denies pain    Home Living Family/patient expects to be discharged to:: Skilled nursing facility                      Prior Function Level of Independence: Needs assistance         Comments: Pt hx not reliable     Hand Dominance        Extremity/Trunk Assessment   Upper Extremity Assessment: Overall WFL for tasks assessed           Lower Extremity Assessment: Overall WFL for tasks assessed         Communication   Communication: Expressive difficulties (cognitively impaired)  Cognition Arousal/Alertness: Awake/alert Behavior During Therapy: WFL for tasks assessed/performed (cognitive impairments. Confusion) Overall Cognitive Status: No family/caregiver present to determine baseline cognitive functioning  General Comments      Exercises        Assessment/Plan    PT Assessment Patent does not need any further PT services  PT Diagnosis  (None)   PT Problem List Cardiopulmonary status limiting activity  PT Treatment Interventions DME instruction;Gait training;Stair training;Functional mobility training;Therapeutic activities;Therapeutic exercise;Balance training;Neuromuscular re-education;Patient/family education;Cognitive remediation;Wheelchair mobility training   PT Goals (Current goals can be found in the Care Plan section)      Frequency Min 2X/week   Barriers to discharge        Co-evaluation                End of Session Equipment Utilized During Treatment: Gait belt Activity Tolerance: Patient tolerated treatment well Patient left: in chair;with call bell/phone within reach;with chair alarm set (nursing notified of being in chair given cognition ) Nurse Communication: Mobility status         Time: 1610-9604 PT Time Calculation (min) (ACUTE ONLY): 30 min   Charges:         PT G CodesBenna Dunks 2015-03-08, 4:35 PM  Benna Dunks, SPT. [phone]

## 2015-03-07 NOTE — Telephone Encounter (Signed)
PLEASE NOTE: All timestamps contained within this report are represented as [place] Standard Time. CONFIDENTIALTY NOTICE: This fax transmission is intended only for the addressee. It contains information that is legally privileged, confidential or otherwise protected from use or disclosure. If you are not the intended recipient, you are strictly prohibited from reviewing, disclosing, copying using or disseminating any of this information or taking any action in reliance on or regarding this information. If you have received this fax in error, please notify us immediately by telephone so that we can arrange for its return to [place]. Phone: [phone], Toll-Free: [phone], Fax: [phone] Page: 1 of 1 Call Id: [id number] [place] [facility] Night - Client TELEPHONE ADVICE RECORD [facility] Medical Call Center Patient Name: ELIYANAH ELGERSMA Gender: Female DOB: Sep 24, 1934 Age: 42 Y 6 M 3 D Return Phone Number: Address: City/State/Zip: [place] [facility] Primary [facility] Night - Client Client Site [facility] [place] - Night Physician Till[facility] Abide Contact Type Call Caller Name Merlinda Frederick Phone Number n/a Relationship To Patient Care Giver Is this call to report lab results? No Call Type General Information Initial Comment Caller states she is from [facility] she is Morrie Sheldon calling for patient she is wanting to document who the Dr is on-call. patient ended up going to hospital. General Information Type Message Only Nurse Assessment Guidelines Guideline Title Affirmed Question Affirmed Notes Nurse Date/Time (Eastern Time) Disp. Time Lamount Cohen Time) Disposition Final User 03/06/2015 9:39:07 PM General Information Provided Yes Sarita Haver After Care Instructions Given Call Event Type User Date / Time Description

## 2015-03-07 NOTE — Clinical Social Work Note (Signed)
Clinical Social Work Assessment  Patient Details  Name: Jordan [name] MRN: [id number] Date of Birth: 08-05-1934  Date of referral:  03/07/15               Reason for consult:  Facility Placement, Other (Comment Required) (From [facility])                Permission sought to share information with:  Oceanographer granted to share information::  Yes, Verbal Permission Granted  Name::      Retail buyer::   Skilled Nursing Facility   Relationship::     Contact Information:     Housing/Transportation Living arrangements for the past 2 months:  Skilled Building surveyor of Information:  Adult Children Patient Interpreter Needed:  None Criminal Activity/Legal Involvement Pertinent to Current Situation/Hospitalization:  No - Comment as needed Significant Relationships:  Adult Children Lives with:  Facility Resident Do you feel safe going back to the place where you live?  Yes Need for family participation in patient care:  Yes (Comment)  Care giving concerns: Patient is a long term care resident at [facility].    Social Worker assessment / plan: Visual merchandiser (CSW) received consult that patient is from a facility. CSW attempted to meet with patient and she was asleep. Per RN patient is not alert and oriented. CSW contacted patient's son Jordan [name]. Per son he is HPOA and patient has been at [facility] since May 2016. Patient was a resident at [facility] before moving to [facility] in May. Son reported that patient has 1 other son and 1 daughter. Son is agreeable for patient to return to [facility]. CSW contacted [facility] at [facility]. Per Jordan [name] patient is a long term care resident and can return when stable.   FL2 on chart.   Employment status:  Retired Health and safety inspector:  Medicare PT Recommendations:  Skilled Nursing Facility Information / Referral to community resources:  Skilled  Nursing Facility  Patient/Family's Response to care: Patient's son is agreeable for patient to return to [facility].   Patient/Family's Understanding of and Emotional Response to Diagnosis, Current Treatment, and Prognosis: Son thanked CSW for calling.   Emotional Assessment Appearance:  Appears stated age Attitude/Demeanor/Rapport:  Unable to Assess Affect (typically observed):  Unable to Assess Orientation:  Fluctuating Orientation (Suspected and/or reported Sundowners) Alcohol / Substance use:  Not Applicable Psych involvement (Current and /or in the community):  No (Comment)  Discharge Needs  Concerns to be addressed:  Discharge Planning Concerns Readmission within the last 30 days:  No Current discharge risk:  Cognitively Impaired Barriers to Discharge:  Continued Medical Work up   [name], LCSW 03/07/2015, 3:25 PM

## 2015-03-07 NOTE — Progress Notes (Signed)
RN notified Dr Clent Ridges to ask about patient's IV fluid expiring, Rn wants to know if MD wants to stop fluid.  MD states "yes"

## 2015-03-07 NOTE — Telephone Encounter (Signed)
PLEASE NOTE: All timestamps contained within this report are represented as [place] Standard Time. CONFIDENTIALTY NOTICE: This fax transmission is intended only for the addressee. It contains information that is legally privileged, confidential or otherwise protected from use or disclosure. If you are not the intended recipient, you are strictly prohibited from reviewing, disclosing, copying using or disseminating any of this information or taking any action in reliance on or regarding this information. If you have received this fax in error, please notify us immediately by telephone so that we can arrange for its return to [place]. Phone: [phone], Toll-Free: [phone], Fax: [phone] Page: 1 of 1 Call Id: [id number] [place] [facility] Night - Client TELEPHONE ADVICE RECORD [facility] Medical Call Center Patient Name: MALILLANY KAZLAUSKAS Gender: Female DOB: [birth year] Age: 80 Y 6 M 2 D Return Phone Number: Address: City/State/Zip: [place] Statistician Primary Care [facility] Night - Client Client Site [facility] [place] - Night Physician Till[place] Abide Contact Type Call Call Type Page Only Caller Name Virl Cagey Relationship To Patient Provider Is this call to report lab results? No Return Phone Number Please choose phone number Initial Comment Caller states she is Morrie Sheldon from [facility] requesting a call back from on call regarding a patient that has a cough and is wheezing. [phone] Nurse Assessment Guidelines Guideline Title Affirmed Question Affirmed Notes Nurse Date/Time Lamount Cohen Time) Disp. Time Lamount Cohen Time) Disposition Final User 03/06/2015 6:21:20 PM Send to [facility] Paging Queue Macarthur Critchley 03/06/2015 6:29:14 PM Paged On Call to Patient Meryle Ready 03/06/2015 6:29:28 PM Page Completed Yes Meryle Ready After Care Instructions Given Call Event Type User Date / Time Description Paging [facility] Phone DateTime Result/Outcome Message Type  Notes Sanda Linger [phone] 03/06/2015 6:29:14 PM Paged On Call to the Patient Doctor Paged Please call Morrie Sheldon with [facility] regarding Jahnai Slingerland at [phone]. Sanda Linger 03/06/2015 6:29:16 PM Paged On Call to the Patient Message Result

## 2015-03-08 DIAGNOSIS — I10 Essential (primary) hypertension: Secondary | ICD-10-CM | POA: Diagnosis not present

## 2015-03-08 DIAGNOSIS — F0390 Unspecified dementia without behavioral disturbance: Secondary | ICD-10-CM | POA: Diagnosis not present

## 2015-03-08 DIAGNOSIS — N390 Urinary tract infection, site not specified: Secondary | ICD-10-CM | POA: Diagnosis not present

## 2015-03-08 DIAGNOSIS — R339 Retention of urine, unspecified: Secondary | ICD-10-CM | POA: Diagnosis present

## 2015-03-08 DIAGNOSIS — G9340 Encephalopathy, unspecified: Secondary | ICD-10-CM | POA: Diagnosis not present

## 2015-03-08 DIAGNOSIS — N3000 Acute cystitis without hematuria: Secondary | ICD-10-CM | POA: Diagnosis not present

## 2015-03-08 LAB — CBC
HCT: 40.9 % (ref 35.0–47.0)
HEMOGLOBIN: 13.8 g/dL (ref 12.0–16.0)
MCH: 31.3 pg (ref 26.0–34.0)
MCHC: 33.8 g/dL (ref 32.0–36.0)
MCV: 92.5 fL (ref 80.0–100.0)
PLATELETS: 132 10*3/uL — AB (ref 150–440)
RBC: 4.42 MIL/uL (ref 3.80–5.20)
RDW: 13.3 % (ref 11.5–14.5)
WBC: 6.2 10*3/uL (ref 3.6–11.0)

## 2015-03-08 LAB — BASIC METABOLIC PANEL
Anion gap: 7 (ref 5–15)
BUN: 12 mg/dL (ref 6–20)
CO2: 24 mmol/L (ref 22–32)
Calcium: 8.5 mg/dL — ABNORMAL LOW (ref 8.9–10.3)
Chloride: 108 mmol/L (ref 101–111)
Creatinine, Ser: 0.75 mg/dL (ref 0.44–1.00)
GFR calc Af Amer: >60 mL/min (ref >60)
GFR calc non Af Amer: >60 mL/min (ref >60)
Glucose, Bld: 101 mg/dL — ABNORMAL HIGH (ref 65–99)
POTASSIUM: 4 mmol/L (ref 3.5–5.1)
Sodium: 139 mmol/L (ref 135–145)

## 2015-03-08 MED ORDER — CIPROFLOXACIN HCL 500 MG PO TABS: 500.0000 mg | ORAL_TABLET | Freq: Two times a day (BID) | ORAL | Status: DC

## 2015-03-08 NOTE — Progress Notes (Signed)
Report called to [place] at [facility]. Removed IV's, belongings packed. Son, [name], is here to take her to [facility].

## 2015-03-08 NOTE — Discharge Summary (Addendum)
[facility] - [place] at [facility]  DISCHARGE SUMMARY   PATIENT NAME: Jordan [name]    MR#:  [id number]  DATE OF BIRTH:  26-Nov-1934  DATE OF ADMISSION:  03/06/2015 ADMITTING PHYSICIAN: Oralia Manis, MD  DATE OF DISCHARGE: 03/08/2015  PRIMARY CARE PHYSICIAN: Tillman Abide, MD    ADMISSION DIAGNOSIS:  Confusion [R41.0] SOB (shortness of breath) [R06.02] UTI (lower urinary tract infection) [N39.0] Hypoxia [R09.02]  DISCHARGE DIAGNOSIS:  Principal Problem:   UTI (lower urinary tract infection) Active Problems:   Dementia   Acute encephalopathy   HTN (hypertension)   Urinary retention   SECONDARY DIAGNOSIS:   Past Medical History  Diagnosis Date  . UTI (lower urinary tract infection)   . Pneumonia   . Dementia   . Hypertension   . Hyperlipemia     HOSPITAL COURSE:    #1 urinary tract infection:  - Initial UA showing 6-30 white blood cells - Urine and blood cultures negative - Treated with 48 hours of Rocephin, transitioning to ciprofloxacin upon discharge to complete 5 day course - Of note she did have some difficulty with urinary retention during hospitalization and this should be assessed in her outpatient setting.   #2 metabolic encephalopathy due to infection: According to her family she is at her baseline. She has advanced dementia with behavioral disturbance. She did require Haldol overnight for behavioral disturbance and aggression. Very disoriented. She will return to her living facility today, being in a familiar environment may help her to reorient somewhat.  3 dementia: Stable, continue home medications no behavioral disturbance. Family states that she is at baseline  4 hypertension: Blood pressure normal at this time.  #5 urinary retention: This is been a problem for this patient in the past. This morning, prior to discharge she did have urinary retention and required in and out catheterization. I would advise in and out  catheterization 3 times a day to assess for urinary retention. Certainly if she is not retaining this would not need to be continued, however she may have a neurogenic bladder. This could be contributing to recurrent urinary tract infections.  DISCHARGE CONDITIONS:   Fair  CONSULTS OBTAINED:     None  DRUG ALLERGIES:  No Known Allergies  DISCHARGE MEDICATIONS:   Current Discharge Medication List    START taking these medications   Details  ciprofloxacin (CIPRO) 500 MG tablet Take 1 tablet (500 mg total) by mouth 2 (two) times daily. Qty: 6 tablet, Refills: 0      CONTINUE these medications which have NOT CHANGED   Details  acetaminophen (TYLENOL) 500 MG tablet Take 500 mg by mouth every 6 (six) hours as needed.    alum & mag hydroxide-simeth (MAALOX PLUS) 400-400-40 MG/5ML suspension Take 5 mLs by mouth every 6 (six) hours as needed for indigestion.    cetirizine (ZYRTEC) 10 MG tablet Take 10 mg by mouth daily.    guaiFENesin (ROBITUSSIN) 100 MG/5ML liquid Take 200 mg by mouth 4 (four) times daily as needed for cough.    loperamide (IMODIUM) 2 MG capsule Take 2 mg by mouth every 3 (three) hours as needed for diarrhea or loose stools.    memantine (NAMENDA XR) 28 MG CP24 24 hr capsule Take 28 mg by mouth every morning.     mirtazapine (REMERON) 15 MG tablet Take 15 mg by mouth at bedtime.    Olopatadine HCl (PATADAY) 0.2 % SOLN Place 1 drop into both eyes every morning.    sertraline (ZOLOFT)  25 MG tablet Take 25 mg by mouth daily.    vitamin B-12 (CYANOCOBALAMIN) 1000 MCG tablet Take 1,000 mcg by mouth daily.    guaiFENesin (MUCINEX) 600 MG 12 hr tablet Take 600 mg by mouth 2 (two) times daily.    loratadine (CLARITIN) 10 MG tablet Take 10 mg by mouth daily.    magnesium hydroxide (MILK OF MAGNESIA) 400 MG/5ML suspension Take 30 mLs by mouth at bedtime as needed for mild constipation.         DISCHARGE INSTRUCTIONS:    DIET:  Regular diet  DISCHARGE  CONDITION:  Fair  ACTIVITY:  Activity as tolerated  OXYGEN:  Home Oxygen: No.   Oxygen Delivery: room air  DISCHARGE LOCATION:  skilled nursing facility  If you experience worsening of your admission symptoms, develop shortness of breath, life threatening emergency, suicidal or homicidal thoughts you must seek medical attention immediately by calling 911 or calling your MD immediately  if symptoms less severe.  You Must read complete instructions/literature along with all the possible adverse reactions/side effects for all the Medicines you take and that have been prescribed to you. Take any new Medicines after you have completely understood and accpet all the possible adverse reactions/side effects.   Please note  You were cared for by a hospitalist during your hospital stay. If you have any questions about your discharge medications or the care you received while you were in the hospital after you are discharged, you can call the unit and asked to speak with the hospitalist on call if the hospitalist that took care of you is not available. Once you are discharged, your primary care physician will handle any further medical issues. Please note that NO REFILLS for any discharge medications will be authorized once you are discharged, as it is imperative that you return to your primary care physician (or establish a relationship with a primary care physician if you do not have one) for your aftercare needs so that they can reassess your need for medications and monitor your lab values.  Today   CHIEF COMPLAINT:   Chief Complaint  Patient presents with  . Shortness of Breath    HISTORY OF PRESENT ILLNESS:  Jordan [name] is a 80 y.o. female who presents with increased confusion. Patient is a resident a local skilled nursing facility, and staff noted her to be more confused. She has baseline dementia, though her symptoms were worse today. Family is present with her in the ED and states  that they were also told that she had a fever. In the ED evaluation elicited a UA positive for nitrites indicating UTI as a likely cause of her symptoms. Given IV Rocephin in the ED, and hospitalists called for admission for UTI and acute encephalopathy. Patient's vital signs are otherwise stable, though her blood pressure is borderline low. VITAL SIGNS:  Blood pressure 135/78, pulse 72, temperature 98 F (36.7 C), temperature source Oral, resp. rate 18, height 5\' 6"  (1.676 m), weight 71.668 kg (158 lb), SpO2 96 %.  I/O:    Intake/Output Summary (Last 24 hours) at 03/08/15 1000 Last data filed at 03/08/15 0900  Gross per 24 hour  Intake    480 ml  Output    650 ml  Net   -170 ml    PHYSICAL EXAMINATION:  GENERAL:  80 y.o.-year-old patient lying in the bed with no acute distress.  LUNGS: Normal breath sounds bilaterally, no wheezing, rales,rhonchi or crepitation. No use of accessory muscles of respiration.  CARDIOVASCULAR: S1, S2 normal. No murmurs, rubs, or gallops.  ABDOMEN: Soft, non-tender, non-distended. Bowel sounds present. No organomegaly or mass.  EXTREMITIES: No pedal edema, cyanosis, or clubbing.  NEUROLOGIC: Cranial nerves II through XII are intact. Muscle strength 5/5 in all extremities. Sensation intact. Gait not checked.  PSYCHIATRIC: The patient is alert, disoriented SKIN: No obvious rash, lesion, or ulcer.   DATA REVIEW:   CBC  Recent Labs Lab 03/08/15 0441  WBC 6.2  HGB 13.8  HCT 40.9  PLT 132*    Chemistries   Recent Labs Lab 03/06/15 1937  03/08/15 0441  NA 136  < > 139  K 3.9  < > 4.0  CL 103  < > 108  CO2 24  < > 24  GLUCOSE 178*  < > 101*  BUN 20  < > 12  CREATININE 1.01*  < > 0.75  CALCIUM 8.9  < > 8.5*  AST 28  --   --   ALT 22  --   --   ALKPHOS 103  --   --   BILITOT 0.5  --   --   < > = values in this interval not displayed.  Cardiac Enzymes  Recent Labs Lab 03/06/15 1937  TROPONINI <0.03    Microbiology Results  Results  for orders placed or performed during the hospital encounter of 03/06/15  Blood culture (routine x 2)     Status: None (Preliminary result)   Collection Time: 03/06/15  7:38 PM  Result Value Ref Range Status   Specimen Description BLOOD RIGHT ARM  Final   Special Requests BAA,10MLAER,5MLANA  Final   Culture NO GROWTH 2 DAYS  Final   Report Status PENDING  Incomplete  Blood culture (routine x 2)     Status: None (Preliminary result)   Collection Time: 03/06/15  7:38 PM  Result Value Ref Range Status   Specimen Description BLOOD LEFT HAND  Final   Special Requests BAA,ANA,AER,5ML  Final   Culture NO GROWTH 2 DAYS  Final   Report Status PENDING  Incomplete  MRSA PCR Screening     Status: None   Collection Time: 03/07/15 12:08 AM  Result Value Ref Range Status   MRSA by PCR NEGATIVE NEGATIVE Final    Comment:        The GeneXpert MRSA Assay (FDA approved for NASAL specimens only), is one component of a comprehensive MRSA colonization surveillance program. It is not intended to diagnose MRSA infection nor to guide or monitor treatment for MRSA infections.     RADIOLOGY:  Dg Chest 2 View  03/06/2015   CLINICAL DATA:  Patient with dementia and increased confusion.  EXAM: CHEST  2 VIEW  COMPARISON:  Chest radiograph 12/13/2014  FINDINGS: Stable cardiac and mediastinal contours. Large hiatal hernia. Low lung volumes. Bibasilar coarse interstitial opacities. Slight interval improvement in previously described right upper lobe density. Likely chronic thickening of the right costophrenic angle. No large effusion identified. Osseous demineralization with mid and lower thoracic spine degenerative changes.  IMPRESSION: Slight interval improvement in previously described right upper lobe likely subpleural process is nonspecific however may represent a resolving infectious process. However, given that this abnormality has persisted for multiple months, further evaluation with chest CT is warranted.    Electronically Signed   By: Annia Belt M.D.   On: 03/06/2015 20:04   Ct Angio Chest Pe W/cm &/or Wo Cm  03/06/2015   CLINICAL DATA:  Shortness of breath. Hypoxia. Increased confusion today.  EXAM: CT ANGIOGRAPHY CHEST WITH CONTRAST  TECHNIQUE: Multidetector CT imaging of the chest was performed using the standard protocol during bolus administration of intravenous contrast. Multiplanar CT image reconstructions and MIPs were obtained to evaluate the vascular anatomy.  CONTRAST:  OMNIPAQUE IOHEXOL 350 MG/ML SOLN  COMPARISON:  None.  FINDINGS: Mediastinum/Lymph Nodes: Satisfactory opacification of pulmonary arteries is noted, and no pulmonary embolism identified. No evidence of thoracic aortic dissection or aneurysm. No masses or pathologically enlarged lymph nodes identified. Large hiatal hernia is seen containing stomach and pancreas.  Lungs/Pleura: No pulmonary infiltrate or mass identified. No effusion present. Subpleural fibrosis and mild traction bronchiectasis seen in the peripheral lower lobes bilaterally. Compressive atelectasis is seen in the left lower lobe secondary to large hiatal hernia.  Musculoskeletal/Soft Tissues: No suspicious bone lesions or other significant chest wall abnormality.  Upper Abdomen:  Unremarkable.  Review of the MIP images confirms the above findings.  IMPRESSION: No evidence of pulmonary embolism or other acute findings.  Large hiatal hernia with left lower lobe compressive atelectasis.  Bilateral lower lobe subpleural fibrosis with mild traction bronchiectasis.   Electronically Signed   By: Myles Rosenthal M.D.   On: 03/06/2015 21:35    EKG:   Orders placed or performed during the hospital encounter of 03/06/15  . ED EKG  . ED EKG      Management plans discussed with the patient, family and they are in agreement.  CODE STATUS:     Code Status Orders        Start     Ordered   03/06/15 2331  Do not attempt resuscitation (DNR)   Continuous    Question  Answer Comment  In the event of cardiac or respiratory ARREST Do not call a "code blue"   In the event of cardiac or respiratory ARREST Do not perform Intubation, CPR, defibrillation or ACLS   In the event of cardiac or respiratory ARREST Use medication by any route, position, wound care, and other measures to relive pain and suffering. May use oxygen, suction and manual treatment of airway obstruction as needed for comfort.      03/06/15 2330      TOTAL TIME TAKING CARE OF THIS PATIENT: 35 minutes.  Greater than 50% of time spent in care coordination and counseling.  Elby Showers M.D on 03/08/2015 at 10:00 AM  Between 7am to 6pm - Pager - [phone]  After 6pm go to [email or web address] - password [email or web address] [facility]  [place] [facility]  Office  [phone]  CC: Primary care physician; Tillman Abide, MD

## 2015-03-08 NOTE — Progress Notes (Addendum)
Patient is medically stable for D/C to [facility] today. Per [name] admissions coordinator at [facility] patient is going to room 308-B. RN will call report at [phone]. Patient's son [name] will provide transport in private vehicle. Clinical Child psychotherapist (CSW) prepared D/C packet and sent D/C summary to [facility] via carefinder. Per [name] patient will be seen by Dr. Alphonsus Sias next Monday. Patient's son [name] is at bedside and aware of above. Please reconsult if future social work needs arise. CSW signing off.   MD updated D/C Summary to include Bladder scan and in and out cath as needed. Per [name] admissions coordinator at [facility] they do have a bladder scaner and will complete in and out cath's as needed. Per [name] this is not a new problem for patient. CSW sent updated D/C Summary to [name].   Jetta Lout, LCSWA [phone]

## 2015-03-08 NOTE — Progress Notes (Signed)
Notified Dr Betti Cruz of bladder scan results of 713.  Order received to in and out cath x1

## 2015-03-10 LAB — URINE CULTURE: Culture: >=100000

## 2015-03-11 DIAGNOSIS — R339 Retention of urine, unspecified: Secondary | ICD-10-CM | POA: Diagnosis not present

## 2015-03-11 DIAGNOSIS — N390 Urinary tract infection, site not specified: Secondary | ICD-10-CM | POA: Diagnosis not present

## 2015-03-11 DIAGNOSIS — R0600 Dyspnea, unspecified: Secondary | ICD-10-CM | POA: Diagnosis not present

## 2015-03-11 LAB — CULTURE, BLOOD (ROUTINE X 2)
CULTURE: NO GROWTH
Culture: NO GROWTH

## 2015-03-13 DIAGNOSIS — R419 Unspecified symptoms and signs involving cognitive functions and awareness: Secondary | ICD-10-CM | POA: Diagnosis not present

## 2015-03-13 DIAGNOSIS — J189 Pneumonia, unspecified organism: Secondary | ICD-10-CM | POA: Diagnosis not present

## 2015-03-13 DIAGNOSIS — F0390 Unspecified dementia without behavioral disturbance: Secondary | ICD-10-CM | POA: Diagnosis not present

## 2015-03-13 DIAGNOSIS — M6281 Muscle weakness (generalized): Secondary | ICD-10-CM | POA: Diagnosis not present

## 2015-03-14 DIAGNOSIS — J189 Pneumonia, unspecified organism: Secondary | ICD-10-CM | POA: Diagnosis not present

## 2015-03-14 DIAGNOSIS — M6281 Muscle weakness (generalized): Secondary | ICD-10-CM | POA: Diagnosis not present

## 2015-03-14 DIAGNOSIS — R419 Unspecified symptoms and signs involving cognitive functions and awareness: Secondary | ICD-10-CM | POA: Diagnosis not present

## 2015-03-14 DIAGNOSIS — F0390 Unspecified dementia without behavioral disturbance: Secondary | ICD-10-CM | POA: Diagnosis not present

## 2015-03-15 ENCOUNTER — Telehealth: Payer: Self-pay

## 2015-03-15 DIAGNOSIS — R419 Unspecified symptoms and signs involving cognitive functions and awareness: Secondary | ICD-10-CM | POA: Diagnosis not present

## 2015-03-15 DIAGNOSIS — F0390 Unspecified dementia without behavioral disturbance: Secondary | ICD-10-CM | POA: Diagnosis not present

## 2015-03-15 DIAGNOSIS — M6281 Muscle weakness (generalized): Secondary | ICD-10-CM | POA: Diagnosis not present

## 2015-03-15 DIAGNOSIS — J189 Pneumonia, unspecified organism: Secondary | ICD-10-CM | POA: Diagnosis not present

## 2015-03-15 NOTE — Telephone Encounter (Signed)
That had happened in hospital and I thought she still had prn order Rene Kocher there this morning and should be able to deal with it

## 2015-03-15 NOTE — Telephone Encounter (Signed)
PLEASE NOTE: All timestamps contained within this report are represented as [place] Standard Time. CONFIDENTIALTY NOTICE: This fax transmission is intended only for the addressee. It contains information that is legally privileged, confidential or otherwise protected from use or disclosure. If you are not the intended recipient, you are strictly prohibited from reviewing, disclosing, copying using or disseminating any of this information or taking any action in reliance on or regarding this information. If you have received this fax in error, please notify us immediately by telephone so that we can arrange for its return to [place]. Phone: [phone], Toll-Free: [phone], Fax: [phone] Page: 1 of 1 Call Id: [id number] [place] [facility] Night - Client TELEPHONE ADVICE RECORD [facility] Call Center Patient Name: Jordan [name] Gender: Female DOB: 05-14-1935 Age: 80 Y 6 M 10 D Return Phone Number: Address: City/State/Zip: [place] [facility] Night - Client Client Site [facility] [place] - Night Physician Tillman Abide Contact Type Call Call Type Page Only Caller Name Jordan [name] Relationship To Patient Provider Is this call to report lab results? No Return Phone Number Please choose phone number Initial Comment Caller states she is Jordan [name] from [facility]. Calling for pt of Dr. Alphonsus Sias pt. is Jordan [name] 21-Nov-1934. She has issues with urinary retention. Complaining of not able to void; and bladder is distended need order to cath. CB# [phone] Nurse Assessment Guidelines Guideline Title Affirmed Question Affirmed Notes Nurse Date/Time Lamount Cohen Time) Disp. Time Lamount Cohen Time) Disposition Final User 03/14/2015 6:24:53 PM Send to [facility] Paging Queue Nadene Rubins 03/14/2015 6:29:12 PM Paged On Call to Other Provider Ulice Dash 03/14/2015 6:28:53 PM Page Completed Yes Ulice Dash After Care Instructions Given Call  Event Type User Date / Time Description Paging DoctorName Phone DateTime Result/Outcome Message Type Notes Genella Mech [phone] 03/14/2015 6:29:11 PM Paged On Call to Other Provider Doctor Paged Genella Mech 03/14/2015 6:29:24 PM Paged On Call to Another Provider Message Result connected on-call with facility

## 2015-03-18 ENCOUNTER — Encounter: Payer: Self-pay | Admitting: *Deleted

## 2015-03-18 ENCOUNTER — Other Ambulatory Visit: Payer: Self-pay | Admitting: *Deleted

## 2015-03-18 DIAGNOSIS — F0390 Unspecified dementia without behavioral disturbance: Secondary | ICD-10-CM | POA: Diagnosis not present

## 2015-03-18 DIAGNOSIS — J189 Pneumonia, unspecified organism: Secondary | ICD-10-CM | POA: Diagnosis not present

## 2015-03-18 DIAGNOSIS — R419 Unspecified symptoms and signs involving cognitive functions and awareness: Secondary | ICD-10-CM | POA: Diagnosis not present

## 2015-03-18 DIAGNOSIS — M6281 Muscle weakness (generalized): Secondary | ICD-10-CM | POA: Diagnosis not present

## 2015-03-19 DIAGNOSIS — R419 Unspecified symptoms and signs involving cognitive functions and awareness: Secondary | ICD-10-CM | POA: Diagnosis not present

## 2015-03-19 DIAGNOSIS — M6281 Muscle weakness (generalized): Secondary | ICD-10-CM | POA: Diagnosis not present

## 2015-03-19 DIAGNOSIS — J189 Pneumonia, unspecified organism: Secondary | ICD-10-CM | POA: Diagnosis not present

## 2015-03-19 DIAGNOSIS — F0390 Unspecified dementia without behavioral disturbance: Secondary | ICD-10-CM | POA: Diagnosis not present

## 2015-03-20 DIAGNOSIS — J189 Pneumonia, unspecified organism: Secondary | ICD-10-CM | POA: Diagnosis not present

## 2015-03-20 DIAGNOSIS — M6281 Muscle weakness (generalized): Secondary | ICD-10-CM | POA: Diagnosis not present

## 2015-03-20 DIAGNOSIS — R419 Unspecified symptoms and signs involving cognitive functions and awareness: Secondary | ICD-10-CM | POA: Diagnosis not present

## 2015-03-20 DIAGNOSIS — F0390 Unspecified dementia without behavioral disturbance: Secondary | ICD-10-CM | POA: Diagnosis not present

## 2015-03-21 DIAGNOSIS — R419 Unspecified symptoms and signs involving cognitive functions and awareness: Secondary | ICD-10-CM | POA: Diagnosis not present

## 2015-03-21 DIAGNOSIS — F0390 Unspecified dementia without behavioral disturbance: Secondary | ICD-10-CM | POA: Diagnosis not present

## 2015-03-21 DIAGNOSIS — M6281 Muscle weakness (generalized): Secondary | ICD-10-CM | POA: Diagnosis not present

## 2015-03-21 DIAGNOSIS — J189 Pneumonia, unspecified organism: Secondary | ICD-10-CM | POA: Diagnosis not present

## 2015-03-25 DIAGNOSIS — J189 Pneumonia, unspecified organism: Secondary | ICD-10-CM | POA: Diagnosis not present

## 2015-03-25 DIAGNOSIS — M6281 Muscle weakness (generalized): Secondary | ICD-10-CM | POA: Diagnosis not present

## 2015-03-25 DIAGNOSIS — R419 Unspecified symptoms and signs involving cognitive functions and awareness: Secondary | ICD-10-CM | POA: Diagnosis not present

## 2015-03-25 DIAGNOSIS — F0390 Unspecified dementia without behavioral disturbance: Secondary | ICD-10-CM | POA: Diagnosis not present

## 2015-03-26 ENCOUNTER — Ambulatory Visit: Payer: Self-pay | Admitting: Urology

## 2015-04-17 DIAGNOSIS — G309 Alzheimer's disease, unspecified: Secondary | ICD-10-CM | POA: Diagnosis not present

## 2015-04-17 DIAGNOSIS — F39 Unspecified mood [affective] disorder: Secondary | ICD-10-CM | POA: Diagnosis not present

## 2015-04-17 DIAGNOSIS — R339 Retention of urine, unspecified: Secondary | ICD-10-CM | POA: Diagnosis not present

## 2015-05-06 DIAGNOSIS — Z23 Encounter for immunization: Secondary | ICD-10-CM | POA: Diagnosis not present

## 2015-05-15 DIAGNOSIS — L989 Disorder of the skin and subcutaneous tissue, unspecified: Secondary | ICD-10-CM | POA: Diagnosis not present

## 2015-05-24 DIAGNOSIS — H10023 Other mucopurulent conjunctivitis, bilateral: Secondary | ICD-10-CM | POA: Diagnosis not present

## 2015-06-06 DIAGNOSIS — J301 Allergic rhinitis due to pollen: Secondary | ICD-10-CM | POA: Diagnosis not present

## 2015-06-06 DIAGNOSIS — F39 Unspecified mood [affective] disorder: Secondary | ICD-10-CM | POA: Diagnosis not present

## 2015-06-06 DIAGNOSIS — G301 Alzheimer's disease with late onset: Secondary | ICD-10-CM | POA: Diagnosis not present

## 2015-07-08 DIAGNOSIS — R419 Unspecified symptoms and signs involving cognitive functions and awareness: Secondary | ICD-10-CM | POA: Diagnosis not present

## 2015-07-08 DIAGNOSIS — R41844 Frontal lobe and executive function deficit: Secondary | ICD-10-CM | POA: Diagnosis not present

## 2015-07-09 DIAGNOSIS — R41844 Frontal lobe and executive function deficit: Secondary | ICD-10-CM | POA: Diagnosis not present

## 2015-07-09 DIAGNOSIS — R419 Unspecified symptoms and signs involving cognitive functions and awareness: Secondary | ICD-10-CM | POA: Diagnosis not present

## 2015-07-10 DIAGNOSIS — R419 Unspecified symptoms and signs involving cognitive functions and awareness: Secondary | ICD-10-CM | POA: Diagnosis not present

## 2015-07-10 DIAGNOSIS — R41844 Frontal lobe and executive function deficit: Secondary | ICD-10-CM | POA: Diagnosis not present

## 2015-07-11 DIAGNOSIS — R41844 Frontal lobe and executive function deficit: Secondary | ICD-10-CM | POA: Diagnosis not present

## 2015-07-11 DIAGNOSIS — R419 Unspecified symptoms and signs involving cognitive functions and awareness: Secondary | ICD-10-CM | POA: Diagnosis not present

## 2015-07-12 DIAGNOSIS — R419 Unspecified symptoms and signs involving cognitive functions and awareness: Secondary | ICD-10-CM | POA: Diagnosis not present

## 2015-07-12 DIAGNOSIS — R41844 Frontal lobe and executive function deficit: Secondary | ICD-10-CM | POA: Diagnosis not present

## 2015-07-15 DIAGNOSIS — R419 Unspecified symptoms and signs involving cognitive functions and awareness: Secondary | ICD-10-CM | POA: Diagnosis not present

## 2015-07-15 DIAGNOSIS — R41844 Frontal lobe and executive function deficit: Secondary | ICD-10-CM | POA: Diagnosis not present

## 2015-07-16 DIAGNOSIS — R419 Unspecified symptoms and signs involving cognitive functions and awareness: Secondary | ICD-10-CM | POA: Diagnosis not present

## 2015-07-16 DIAGNOSIS — R41844 Frontal lobe and executive function deficit: Secondary | ICD-10-CM | POA: Diagnosis not present

## 2015-07-17 DIAGNOSIS — R419 Unspecified symptoms and signs involving cognitive functions and awareness: Secondary | ICD-10-CM | POA: Diagnosis not present

## 2015-07-17 DIAGNOSIS — R41844 Frontal lobe and executive function deficit: Secondary | ICD-10-CM | POA: Diagnosis not present

## 2015-07-18 DIAGNOSIS — R41844 Frontal lobe and executive function deficit: Secondary | ICD-10-CM | POA: Diagnosis not present

## 2015-07-18 DIAGNOSIS — R419 Unspecified symptoms and signs involving cognitive functions and awareness: Secondary | ICD-10-CM | POA: Diagnosis not present

## 2015-07-18 DIAGNOSIS — L03032 Cellulitis of left toe: Secondary | ICD-10-CM | POA: Diagnosis not present

## 2015-07-19 DIAGNOSIS — R41844 Frontal lobe and executive function deficit: Secondary | ICD-10-CM | POA: Diagnosis not present

## 2015-07-19 DIAGNOSIS — R419 Unspecified symptoms and signs involving cognitive functions and awareness: Secondary | ICD-10-CM | POA: Diagnosis not present

## 2015-07-22 DIAGNOSIS — R419 Unspecified symptoms and signs involving cognitive functions and awareness: Secondary | ICD-10-CM | POA: Diagnosis not present

## 2015-07-22 DIAGNOSIS — R41844 Frontal lobe and executive function deficit: Secondary | ICD-10-CM | POA: Diagnosis not present

## 2015-07-23 DIAGNOSIS — R41844 Frontal lobe and executive function deficit: Secondary | ICD-10-CM | POA: Diagnosis not present

## 2015-07-23 DIAGNOSIS — R419 Unspecified symptoms and signs involving cognitive functions and awareness: Secondary | ICD-10-CM | POA: Diagnosis not present

## 2015-07-24 DIAGNOSIS — R41844 Frontal lobe and executive function deficit: Secondary | ICD-10-CM | POA: Diagnosis not present

## 2015-07-24 DIAGNOSIS — R419 Unspecified symptoms and signs involving cognitive functions and awareness: Secondary | ICD-10-CM | POA: Diagnosis not present

## 2015-07-25 DIAGNOSIS — E785 Hyperlipidemia, unspecified: Secondary | ICD-10-CM | POA: Diagnosis not present

## 2015-07-25 DIAGNOSIS — R41844 Frontal lobe and executive function deficit: Secondary | ICD-10-CM | POA: Diagnosis not present

## 2015-07-25 DIAGNOSIS — R419 Unspecified symptoms and signs involving cognitive functions and awareness: Secondary | ICD-10-CM | POA: Diagnosis not present

## 2015-07-25 DIAGNOSIS — I10 Essential (primary) hypertension: Secondary | ICD-10-CM | POA: Diagnosis not present

## 2015-07-26 DIAGNOSIS — R41844 Frontal lobe and executive function deficit: Secondary | ICD-10-CM | POA: Diagnosis not present

## 2015-07-26 DIAGNOSIS — R419 Unspecified symptoms and signs involving cognitive functions and awareness: Secondary | ICD-10-CM | POA: Diagnosis not present

## 2015-07-29 DIAGNOSIS — R419 Unspecified symptoms and signs involving cognitive functions and awareness: Secondary | ICD-10-CM | POA: Diagnosis not present

## 2015-07-29 DIAGNOSIS — R41844 Frontal lobe and executive function deficit: Secondary | ICD-10-CM | POA: Diagnosis not present

## 2015-07-30 DIAGNOSIS — R419 Unspecified symptoms and signs involving cognitive functions and awareness: Secondary | ICD-10-CM | POA: Diagnosis not present

## 2015-07-30 DIAGNOSIS — R41844 Frontal lobe and executive function deficit: Secondary | ICD-10-CM | POA: Diagnosis not present

## 2015-07-31 DIAGNOSIS — R419 Unspecified symptoms and signs involving cognitive functions and awareness: Secondary | ICD-10-CM | POA: Diagnosis not present

## 2015-07-31 DIAGNOSIS — R41844 Frontal lobe and executive function deficit: Secondary | ICD-10-CM | POA: Diagnosis not present

## 2015-08-01 DIAGNOSIS — R41844 Frontal lobe and executive function deficit: Secondary | ICD-10-CM | POA: Diagnosis not present

## 2015-08-01 DIAGNOSIS — R419 Unspecified symptoms and signs involving cognitive functions and awareness: Secondary | ICD-10-CM | POA: Diagnosis not present

## 2015-08-21 DIAGNOSIS — G309 Alzheimer's disease, unspecified: Secondary | ICD-10-CM | POA: Diagnosis not present

## 2015-08-21 DIAGNOSIS — J301 Allergic rhinitis due to pollen: Secondary | ICD-10-CM | POA: Diagnosis not present

## 2015-08-21 DIAGNOSIS — F39 Unspecified mood [affective] disorder: Secondary | ICD-10-CM | POA: Diagnosis not present

## 2015-10-10 DIAGNOSIS — F39 Unspecified mood [affective] disorder: Secondary | ICD-10-CM | POA: Diagnosis not present

## 2015-10-10 DIAGNOSIS — G301 Alzheimer's disease with late onset: Secondary | ICD-10-CM | POA: Diagnosis not present

## 2015-10-10 DIAGNOSIS — J309 Allergic rhinitis, unspecified: Secondary | ICD-10-CM | POA: Diagnosis not present

## 2015-10-13 DIAGNOSIS — R0989 Other specified symptoms and signs involving the circulatory and respiratory systems: Secondary | ICD-10-CM | POA: Diagnosis not present

## 2015-10-13 DIAGNOSIS — R05 Cough: Secondary | ICD-10-CM | POA: Diagnosis not present

## 2015-10-14 ENCOUNTER — Telehealth: Payer: Self-pay

## 2015-10-14 NOTE — Telephone Encounter (Signed)
PLEASE NOTE: All timestamps contained within this report are represented as [place] Standard Time. CONFIDENTIALTY NOTICE: This fax transmission is intended only for the addressee. It contains information that is legally privileged, confidential or otherwise protected from use or disclosure. If you are not the intended recipient, you are strictly prohibited from reviewing, disclosing, copying using or disseminating any of this information or taking any action in reliance on or regarding this information. If you have received this fax in error, please notify us immediately by telephone so that we can arrange for its return to us. Phone: [phone], Toll-Free: [phone], Fax: [phone] Page: 1 of 1 Call Id: [id number] [facility] [facility] Client Nonclinical Telephone Record [facility] Client [facility] [place] [facility] [place] Night Physician Tillman AbideLetvak, Richard Contact Type Call Who Is Calling Physician / Provider / Hospital Call Type Provider Call [facility] Page Now Reason for Call Request for Orders Initial Comment Caller states patient is having signs of upper respiratory infection. wants orders for CXR Additional Comment Patient Name Jordan [name] Patient DOB 08/15/1934 Requesting Provider [facility] Phys[facility] Number [phone] [facility] DateTime Result/Outcome Message Type Notes Hannah BeatCopland, Spencer [phone] 10/13/2015 1:54:43 PM Paged On Call to Other Provider Doctor Paged Hannah BeatCopland, Spencer 10/13/2015 1:54:51 PM Paged On Call to Another Provider Message Result Call Closed By: Angelica ChessmanMandy May Transaction Date/Time: 10/13/2015 1:46:18 PM (ET)

## 2015-10-14 NOTE — Telephone Encounter (Signed)
Addressed at nursing home this morning

## 2015-10-22 DIAGNOSIS — J069 Acute upper respiratory infection, unspecified: Secondary | ICD-10-CM | POA: Diagnosis not present

## 2015-11-19 DIAGNOSIS — R05 Cough: Secondary | ICD-10-CM | POA: Diagnosis not present

## 2015-11-20 DIAGNOSIS — R05 Cough: Secondary | ICD-10-CM | POA: Diagnosis not present

## 2015-12-06 DIAGNOSIS — J301 Allergic rhinitis due to pollen: Secondary | ICD-10-CM | POA: Diagnosis not present

## 2015-12-06 DIAGNOSIS — G309 Alzheimer's disease, unspecified: Secondary | ICD-10-CM | POA: Diagnosis not present

## 2015-12-06 DIAGNOSIS — F39 Unspecified mood [affective] disorder: Secondary | ICD-10-CM | POA: Diagnosis not present

## 2016-01-27 ENCOUNTER — Telehealth: Payer: Self-pay | Admitting: *Deleted

## 2016-01-27 MED ORDER — OLOPATADINE HCL 0.2 % OP SOLN: 1.0000 [drp] | OPHTHALMIC | Status: AC

## 2016-01-27 NOTE — Telephone Encounter (Signed)
rx sent to [facility] and refill request from [facility] removed from [facility]

## 2016-01-27 NOTE — Telephone Encounter (Signed)
[facility]

## 2016-01-27 NOTE — Telephone Encounter (Signed)
Request from [facility] to provide generic sub for Olopatadine Opt Sol in your IN box. Requires signature.

## 2016-02-03 DIAGNOSIS — F22 Delusional disorders: Secondary | ICD-10-CM | POA: Diagnosis not present

## 2016-02-03 DIAGNOSIS — F39 Unspecified mood [affective] disorder: Secondary | ICD-10-CM | POA: Diagnosis not present

## 2016-02-03 DIAGNOSIS — J301 Allergic rhinitis due to pollen: Secondary | ICD-10-CM | POA: Diagnosis not present

## 2016-02-03 DIAGNOSIS — G301 Alzheimer's disease with late onset: Secondary | ICD-10-CM | POA: Diagnosis not present

## 2016-04-15 DIAGNOSIS — G309 Alzheimer's disease, unspecified: Secondary | ICD-10-CM | POA: Diagnosis not present

## 2016-04-15 DIAGNOSIS — F39 Unspecified mood [affective] disorder: Secondary | ICD-10-CM | POA: Diagnosis not present

## 2016-04-15 DIAGNOSIS — T7840XA Allergy, unspecified, initial encounter: Secondary | ICD-10-CM | POA: Diagnosis not present

## 2016-04-15 DIAGNOSIS — F062 Psychotic disorder with delusions due to known physiological condition: Secondary | ICD-10-CM | POA: Diagnosis not present

## 2016-05-04 DIAGNOSIS — Z23 Encounter for immunization: Secondary | ICD-10-CM | POA: Diagnosis not present

## 2016-06-10 DIAGNOSIS — Y92129 Unspecified place in nursing home as the place of occurrence of the external cause: Secondary | ICD-10-CM | POA: Diagnosis not present

## 2016-06-10 DIAGNOSIS — M25522 Pain in left elbow: Secondary | ICD-10-CM | POA: Diagnosis not present

## 2016-06-11 DIAGNOSIS — M25522 Pain in left elbow: Secondary | ICD-10-CM | POA: Diagnosis not present

## 2016-06-11 DIAGNOSIS — S52032A Displaced fracture of olecranon process with intraarticular extension of left ulna, initial encounter for closed fracture: Secondary | ICD-10-CM | POA: Diagnosis not present

## 2016-06-16 DIAGNOSIS — R2681 Unsteadiness on feet: Secondary | ICD-10-CM | POA: Diagnosis not present

## 2016-06-17 DIAGNOSIS — R2681 Unsteadiness on feet: Secondary | ICD-10-CM | POA: Diagnosis not present

## 2016-06-18 DIAGNOSIS — S52032D Displaced fracture of olecranon process with intraarticular extension of left ulna, subsequent encounter for closed fracture with routine healing: Secondary | ICD-10-CM | POA: Diagnosis not present

## 2016-06-18 DIAGNOSIS — R2681 Unsteadiness on feet: Secondary | ICD-10-CM | POA: Diagnosis not present

## 2016-06-18 DIAGNOSIS — M25522 Pain in left elbow: Secondary | ICD-10-CM | POA: Diagnosis not present

## 2016-06-19 DIAGNOSIS — R2681 Unsteadiness on feet: Secondary | ICD-10-CM | POA: Diagnosis not present

## 2016-06-21 DIAGNOSIS — R2681 Unsteadiness on feet: Secondary | ICD-10-CM | POA: Diagnosis not present

## 2016-06-22 DIAGNOSIS — R2681 Unsteadiness on feet: Secondary | ICD-10-CM | POA: Diagnosis not present

## 2016-06-23 DIAGNOSIS — R2681 Unsteadiness on feet: Secondary | ICD-10-CM | POA: Diagnosis not present

## 2016-06-24 DIAGNOSIS — R2681 Unsteadiness on feet: Secondary | ICD-10-CM | POA: Diagnosis not present

## 2016-06-26 DIAGNOSIS — R2681 Unsteadiness on feet: Secondary | ICD-10-CM | POA: Diagnosis not present

## 2016-06-28 DIAGNOSIS — J301 Allergic rhinitis due to pollen: Secondary | ICD-10-CM | POA: Diagnosis not present

## 2016-06-28 DIAGNOSIS — F22 Delusional disorders: Secondary | ICD-10-CM | POA: Diagnosis not present

## 2016-06-28 DIAGNOSIS — F39 Unspecified mood [affective] disorder: Secondary | ICD-10-CM | POA: Diagnosis not present

## 2016-06-28 DIAGNOSIS — G301 Alzheimer's disease with late onset: Secondary | ICD-10-CM | POA: Diagnosis not present

## 2016-06-29 DIAGNOSIS — R2681 Unsteadiness on feet: Secondary | ICD-10-CM | POA: Diagnosis not present

## 2016-06-30 DIAGNOSIS — R2681 Unsteadiness on feet: Secondary | ICD-10-CM | POA: Diagnosis not present

## 2016-07-01 DIAGNOSIS — R2681 Unsteadiness on feet: Secondary | ICD-10-CM | POA: Diagnosis not present

## 2016-07-02 DIAGNOSIS — R2681 Unsteadiness on feet: Secondary | ICD-10-CM | POA: Diagnosis not present

## 2016-07-02 DIAGNOSIS — S52032D Displaced fracture of olecranon process with intraarticular extension of left ulna, subsequent encounter for closed fracture with routine healing: Secondary | ICD-10-CM | POA: Diagnosis not present

## 2016-07-03 DIAGNOSIS — S52022S Displaced fracture of olecranon process without intraarticular extension of left ulna, sequela: Secondary | ICD-10-CM | POA: Diagnosis not present

## 2016-07-03 DIAGNOSIS — R2681 Unsteadiness on feet: Secondary | ICD-10-CM | POA: Diagnosis not present

## 2016-07-06 DIAGNOSIS — J189 Pneumonia, unspecified organism: Secondary | ICD-10-CM | POA: Diagnosis not present

## 2016-07-06 DIAGNOSIS — J181 Lobar pneumonia, unspecified organism: Secondary | ICD-10-CM | POA: Diagnosis not present

## 2016-07-09 DIAGNOSIS — S52022S Displaced fracture of olecranon process without intraarticular extension of left ulna, sequela: Secondary | ICD-10-CM | POA: Diagnosis not present

## 2016-07-09 DIAGNOSIS — R2681 Unsteadiness on feet: Secondary | ICD-10-CM | POA: Diagnosis not present

## 2016-07-10 DIAGNOSIS — S52022S Displaced fracture of olecranon process without intraarticular extension of left ulna, sequela: Secondary | ICD-10-CM | POA: Diagnosis not present

## 2016-07-10 DIAGNOSIS — R2681 Unsteadiness on feet: Secondary | ICD-10-CM | POA: Diagnosis not present

## 2016-07-13 DIAGNOSIS — R2681 Unsteadiness on feet: Secondary | ICD-10-CM | POA: Diagnosis not present

## 2016-07-13 DIAGNOSIS — S52022S Displaced fracture of olecranon process without intraarticular extension of left ulna, sequela: Secondary | ICD-10-CM | POA: Diagnosis not present

## 2016-07-14 DIAGNOSIS — S52022S Displaced fracture of olecranon process without intraarticular extension of left ulna, sequela: Secondary | ICD-10-CM | POA: Diagnosis not present

## 2016-07-14 DIAGNOSIS — R2681 Unsteadiness on feet: Secondary | ICD-10-CM | POA: Diagnosis not present

## 2016-07-15 DIAGNOSIS — R2681 Unsteadiness on feet: Secondary | ICD-10-CM | POA: Diagnosis not present

## 2016-07-15 DIAGNOSIS — S52022S Displaced fracture of olecranon process without intraarticular extension of left ulna, sequela: Secondary | ICD-10-CM | POA: Diagnosis not present

## 2016-07-16 DIAGNOSIS — M25522 Pain in left elbow: Secondary | ICD-10-CM | POA: Diagnosis not present

## 2016-07-16 DIAGNOSIS — S52032D Displaced fracture of olecranon process with intraarticular extension of left ulna, subsequent encounter for closed fracture with routine healing: Secondary | ICD-10-CM | POA: Diagnosis not present

## 2016-07-16 DIAGNOSIS — S52022S Displaced fracture of olecranon process without intraarticular extension of left ulna, sequela: Secondary | ICD-10-CM | POA: Diagnosis not present

## 2016-07-16 DIAGNOSIS — R2681 Unsteadiness on feet: Secondary | ICD-10-CM | POA: Diagnosis not present

## 2016-07-17 DIAGNOSIS — S52022S Displaced fracture of olecranon process without intraarticular extension of left ulna, sequela: Secondary | ICD-10-CM | POA: Diagnosis not present

## 2016-07-17 DIAGNOSIS — R2681 Unsteadiness on feet: Secondary | ICD-10-CM | POA: Diagnosis not present

## 2016-07-20 DIAGNOSIS — S52022S Displaced fracture of olecranon process without intraarticular extension of left ulna, sequela: Secondary | ICD-10-CM | POA: Diagnosis not present

## 2016-07-20 DIAGNOSIS — R2681 Unsteadiness on feet: Secondary | ICD-10-CM | POA: Diagnosis not present

## 2016-08-06 DIAGNOSIS — S52032D Displaced fracture of olecranon process with intraarticular extension of left ulna, subsequent encounter for closed fracture with routine healing: Secondary | ICD-10-CM | POA: Diagnosis not present

## 2016-08-06 DIAGNOSIS — M25522 Pain in left elbow: Secondary | ICD-10-CM | POA: Diagnosis not present

## 2016-08-07 DIAGNOSIS — M24522 Contracture, left elbow: Secondary | ICD-10-CM | POA: Diagnosis not present

## 2016-08-08 DIAGNOSIS — M24522 Contracture, left elbow: Secondary | ICD-10-CM | POA: Diagnosis not present

## 2016-08-11 DIAGNOSIS — M24522 Contracture, left elbow: Secondary | ICD-10-CM | POA: Diagnosis not present

## 2016-08-12 DIAGNOSIS — M24522 Contracture, left elbow: Secondary | ICD-10-CM | POA: Diagnosis not present

## 2016-08-13 DIAGNOSIS — M24522 Contracture, left elbow: Secondary | ICD-10-CM | POA: Diagnosis not present

## 2016-08-14 DIAGNOSIS — M24522 Contracture, left elbow: Secondary | ICD-10-CM | POA: Diagnosis not present

## 2016-08-17 DIAGNOSIS — M24522 Contracture, left elbow: Secondary | ICD-10-CM | POA: Diagnosis not present

## 2016-08-18 DIAGNOSIS — M24522 Contracture, left elbow: Secondary | ICD-10-CM | POA: Diagnosis not present

## 2016-08-20 DIAGNOSIS — M24522 Contracture, left elbow: Secondary | ICD-10-CM | POA: Diagnosis not present

## 2016-08-21 DIAGNOSIS — M24522 Contracture, left elbow: Secondary | ICD-10-CM | POA: Diagnosis not present

## 2016-08-26 DIAGNOSIS — F323 Major depressive disorder, single episode, severe with psychotic features: Secondary | ICD-10-CM

## 2016-08-26 DIAGNOSIS — T7840XA Allergy, unspecified, initial encounter: Secondary | ICD-10-CM

## 2016-08-26 DIAGNOSIS — G309 Alzheimer's disease, unspecified: Secondary | ICD-10-CM

## 2016-08-26 DIAGNOSIS — F22 Delusional disorders: Secondary | ICD-10-CM

## 2016-09-04 DIAGNOSIS — R339 Retention of urine, unspecified: Secondary | ICD-10-CM | POA: Diagnosis not present

## 2016-10-27 DIAGNOSIS — G301 Alzheimer's disease with late onset: Secondary | ICD-10-CM | POA: Diagnosis not present

## 2016-10-27 DIAGNOSIS — R34 Anuria and oliguria: Secondary | ICD-10-CM | POA: Diagnosis not present

## 2016-10-27 DIAGNOSIS — F062 Psychotic disorder with delusions due to known physiological condition: Secondary | ICD-10-CM | POA: Diagnosis not present

## 2016-10-27 DIAGNOSIS — F39 Unspecified mood [affective] disorder: Secondary | ICD-10-CM | POA: Diagnosis not present

## 2016-10-29 DIAGNOSIS — I10 Essential (primary) hypertension: Secondary | ICD-10-CM | POA: Diagnosis not present

## 2016-12-17 DIAGNOSIS — M6281 Muscle weakness (generalized): Secondary | ICD-10-CM | POA: Diagnosis not present

## 2016-12-17 DIAGNOSIS — R278 Other lack of coordination: Secondary | ICD-10-CM | POA: Diagnosis not present

## 2016-12-17 DIAGNOSIS — R2681 Unsteadiness on feet: Secondary | ICD-10-CM | POA: Diagnosis not present

## 2016-12-18 DIAGNOSIS — R278 Other lack of coordination: Secondary | ICD-10-CM | POA: Diagnosis not present

## 2016-12-18 DIAGNOSIS — R34 Anuria and oliguria: Secondary | ICD-10-CM | POA: Diagnosis not present

## 2016-12-18 DIAGNOSIS — F39 Unspecified mood [affective] disorder: Secondary | ICD-10-CM | POA: Diagnosis not present

## 2016-12-18 DIAGNOSIS — F22 Delusional disorders: Secondary | ICD-10-CM | POA: Diagnosis not present

## 2016-12-18 DIAGNOSIS — R2681 Unsteadiness on feet: Secondary | ICD-10-CM | POA: Diagnosis not present

## 2016-12-18 DIAGNOSIS — G309 Alzheimer's disease, unspecified: Secondary | ICD-10-CM | POA: Diagnosis not present

## 2016-12-18 DIAGNOSIS — M6281 Muscle weakness (generalized): Secondary | ICD-10-CM | POA: Diagnosis not present

## 2016-12-18 DIAGNOSIS — R634 Abnormal weight loss: Secondary | ICD-10-CM | POA: Diagnosis not present

## 2016-12-20 DIAGNOSIS — R278 Other lack of coordination: Secondary | ICD-10-CM | POA: Diagnosis not present

## 2016-12-20 DIAGNOSIS — M6281 Muscle weakness (generalized): Secondary | ICD-10-CM | POA: Diagnosis not present

## 2016-12-20 DIAGNOSIS — R2681 Unsteadiness on feet: Secondary | ICD-10-CM | POA: Diagnosis not present

## 2016-12-21 DIAGNOSIS — M6281 Muscle weakness (generalized): Secondary | ICD-10-CM | POA: Diagnosis not present

## 2016-12-21 DIAGNOSIS — R2681 Unsteadiness on feet: Secondary | ICD-10-CM | POA: Diagnosis not present

## 2016-12-21 DIAGNOSIS — R278 Other lack of coordination: Secondary | ICD-10-CM | POA: Diagnosis not present

## 2016-12-22 DIAGNOSIS — M6281 Muscle weakness (generalized): Secondary | ICD-10-CM | POA: Diagnosis not present

## 2016-12-22 DIAGNOSIS — R2681 Unsteadiness on feet: Secondary | ICD-10-CM | POA: Diagnosis not present

## 2016-12-22 DIAGNOSIS — R278 Other lack of coordination: Secondary | ICD-10-CM | POA: Diagnosis not present

## 2016-12-23 DIAGNOSIS — M6281 Muscle weakness (generalized): Secondary | ICD-10-CM | POA: Diagnosis not present

## 2016-12-23 DIAGNOSIS — R278 Other lack of coordination: Secondary | ICD-10-CM | POA: Diagnosis not present

## 2016-12-23 DIAGNOSIS — R2681 Unsteadiness on feet: Secondary | ICD-10-CM | POA: Diagnosis not present

## 2016-12-24 DIAGNOSIS — R278 Other lack of coordination: Secondary | ICD-10-CM | POA: Diagnosis not present

## 2016-12-24 DIAGNOSIS — M6281 Muscle weakness (generalized): Secondary | ICD-10-CM | POA: Diagnosis not present

## 2016-12-24 DIAGNOSIS — R2681 Unsteadiness on feet: Secondary | ICD-10-CM | POA: Diagnosis not present

## 2016-12-25 DIAGNOSIS — R2681 Unsteadiness on feet: Secondary | ICD-10-CM | POA: Diagnosis not present

## 2016-12-25 DIAGNOSIS — M6281 Muscle weakness (generalized): Secondary | ICD-10-CM | POA: Diagnosis not present

## 2016-12-25 DIAGNOSIS — R278 Other lack of coordination: Secondary | ICD-10-CM | POA: Diagnosis not present

## 2016-12-26 DIAGNOSIS — R278 Other lack of coordination: Secondary | ICD-10-CM | POA: Diagnosis not present

## 2016-12-26 DIAGNOSIS — M6281 Muscle weakness (generalized): Secondary | ICD-10-CM | POA: Diagnosis not present

## 2016-12-26 DIAGNOSIS — R2681 Unsteadiness on feet: Secondary | ICD-10-CM | POA: Diagnosis not present

## 2016-12-28 DIAGNOSIS — R278 Other lack of coordination: Secondary | ICD-10-CM | POA: Diagnosis not present

## 2016-12-28 DIAGNOSIS — M6281 Muscle weakness (generalized): Secondary | ICD-10-CM | POA: Diagnosis not present

## 2016-12-28 DIAGNOSIS — R2681 Unsteadiness on feet: Secondary | ICD-10-CM | POA: Diagnosis not present

## 2017-01-05 DIAGNOSIS — M79602 Pain in left arm: Secondary | ICD-10-CM | POA: Diagnosis not present

## 2017-02-09 DIAGNOSIS — F062 Psychotic disorder with delusions due to known physiological condition: Secondary | ICD-10-CM | POA: Diagnosis not present

## 2017-02-09 DIAGNOSIS — G301 Alzheimer's disease with late onset: Secondary | ICD-10-CM | POA: Diagnosis not present

## 2017-02-09 DIAGNOSIS — F39 Unspecified mood [affective] disorder: Secondary | ICD-10-CM | POA: Diagnosis not present

## 2017-02-26 IMAGING — CR DG CHEST 1V PORT
1 series · 2 of 2 positions shown · non-contrast
Comparison: 10/17/2012

CLINICAL DATA: Cough and fever

EXAM:
PORTABLE CHEST - 1 VIEW

[Series 1: ap · 0.17mm/px · 2 of 2 slices shown]
[im 1/2]
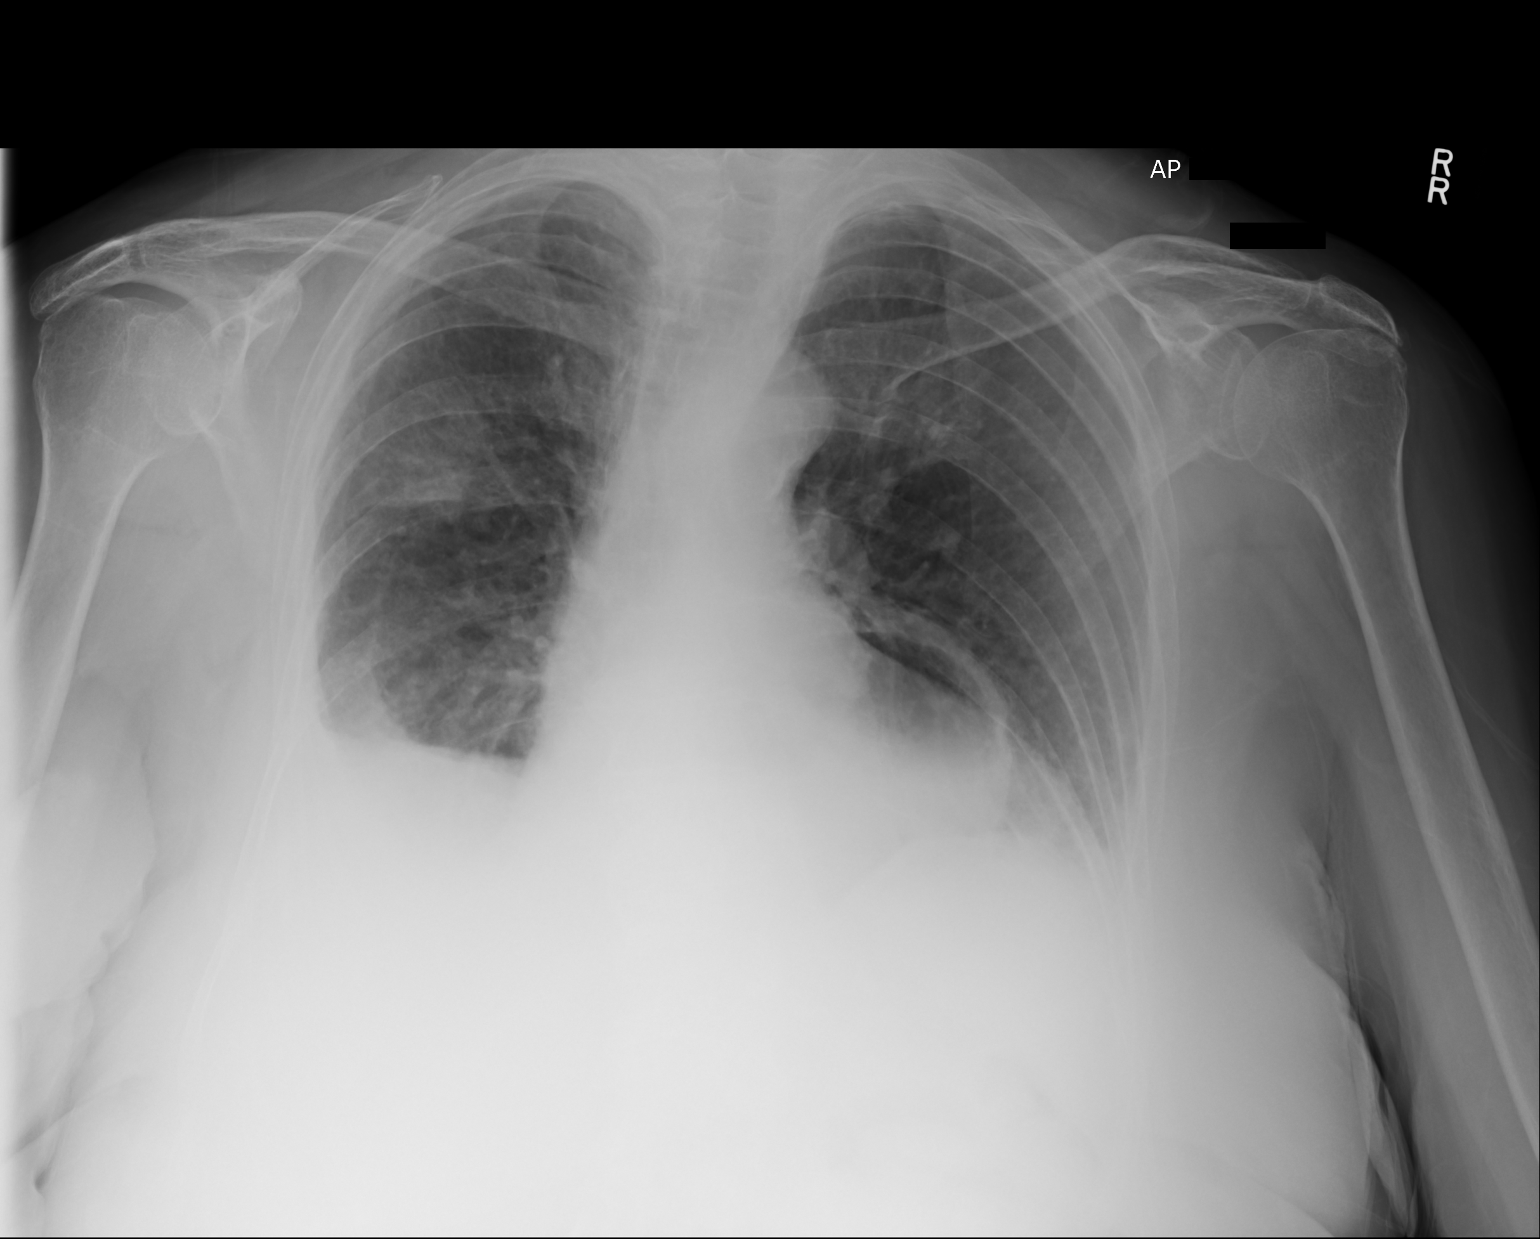
[im 2/2]
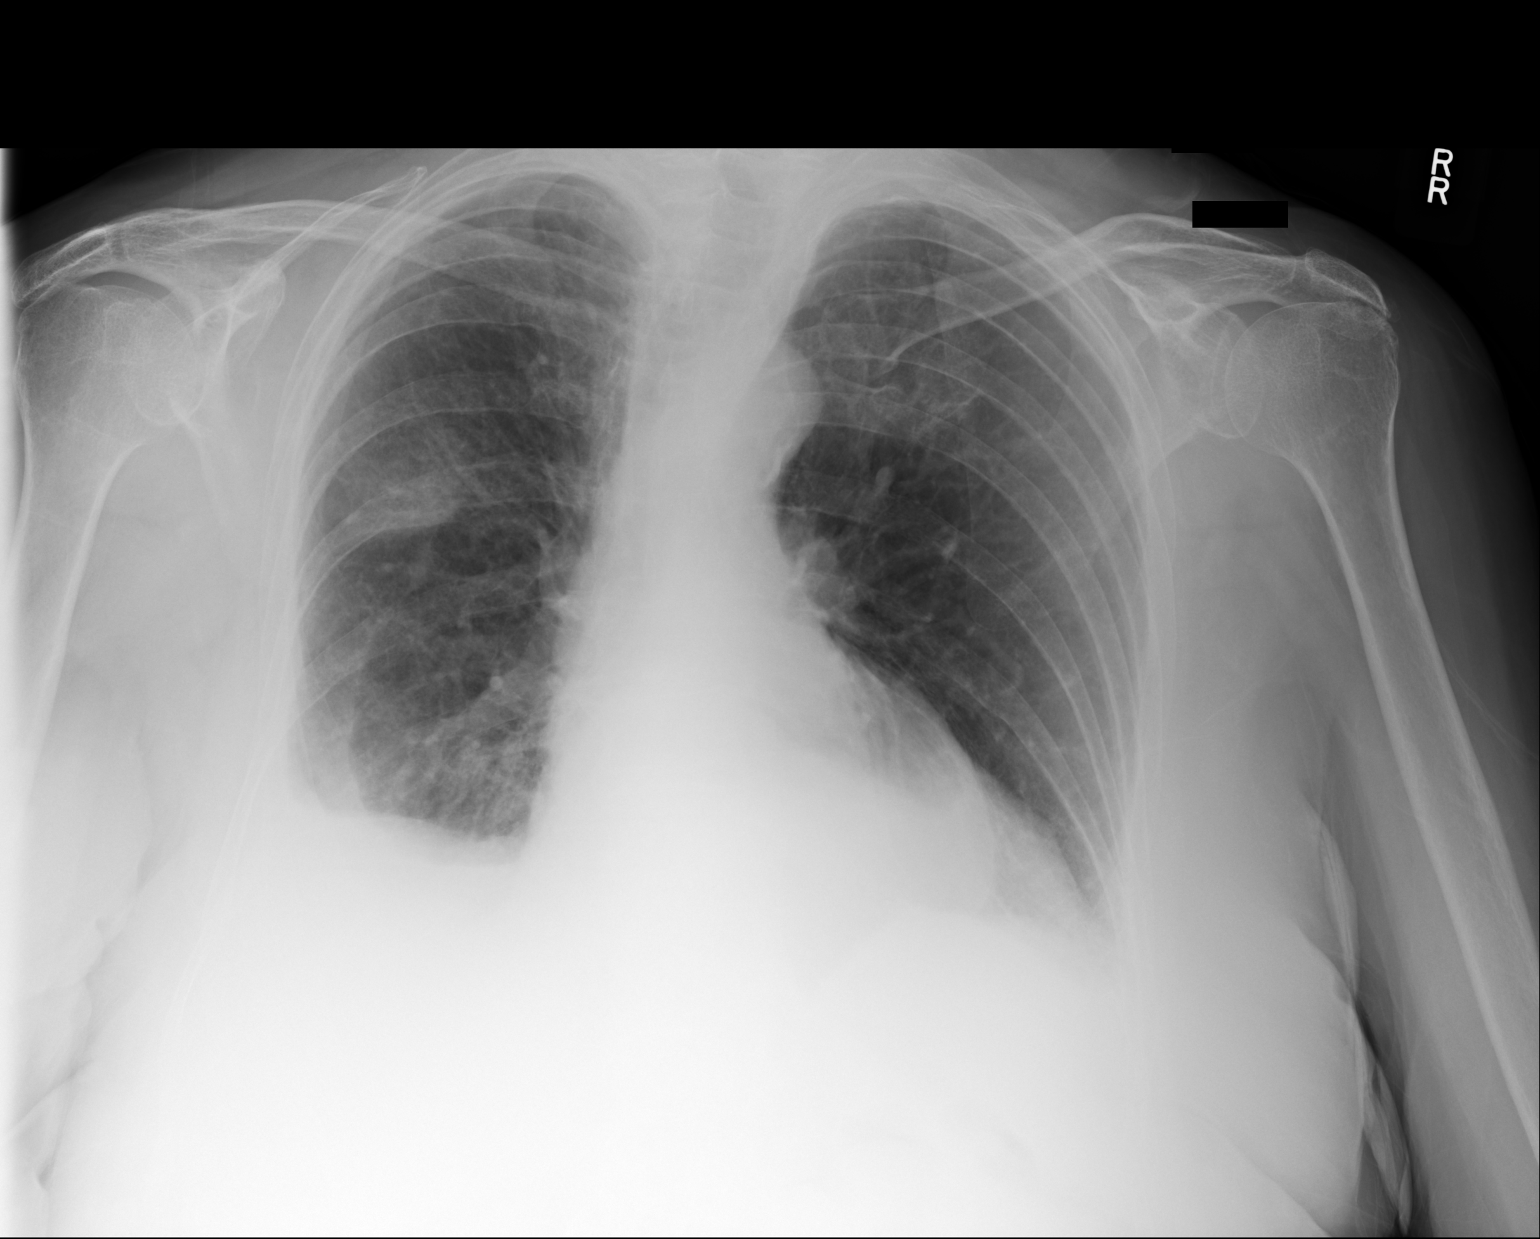

[2 of 2 positions shown; findings below may reference images not displayed]

FINDINGS: The cardiac silhouette, mediastinal and hilar contours are stable.
Suspect small bilateral pleural effusions, right greater than left.
There is also airspace opacity in the right lung suspicious for
pneumonia. A large hiatal hernia is noted. The bony thorax is
intact.
IMPRESSION: Right upper lobe infiltrate.

Small bilateral pleural effusions and bibasilar atelectasis.

## 2017-04-21 DIAGNOSIS — F22 Delusional disorders: Secondary | ICD-10-CM | POA: Diagnosis not present

## 2017-04-21 DIAGNOSIS — G309 Alzheimer's disease, unspecified: Secondary | ICD-10-CM | POA: Diagnosis not present

## 2017-04-21 DIAGNOSIS — F339 Major depressive disorder, recurrent, unspecified: Secondary | ICD-10-CM | POA: Diagnosis not present

## 2017-06-17 DIAGNOSIS — G301 Alzheimer's disease with late onset: Secondary | ICD-10-CM | POA: Diagnosis not present

## 2017-06-17 DIAGNOSIS — J309 Allergic rhinitis, unspecified: Secondary | ICD-10-CM | POA: Diagnosis not present

## 2017-06-17 DIAGNOSIS — F39 Unspecified mood [affective] disorder: Secondary | ICD-10-CM | POA: Diagnosis not present

## 2017-06-18 IMAGING — CR DG CHEST 2V
1 series · 2 of 2 positions shown · non-contrast
Comparison: Chest radiograph 12/13/2014

CLINICAL DATA: Patient with dementia and increased confusion.

EXAM:
CHEST  2 VIEW

[Series 1: dg chest 2 view · 0.14mm/px · 2 of 2 slices shown]
[im 1/2]
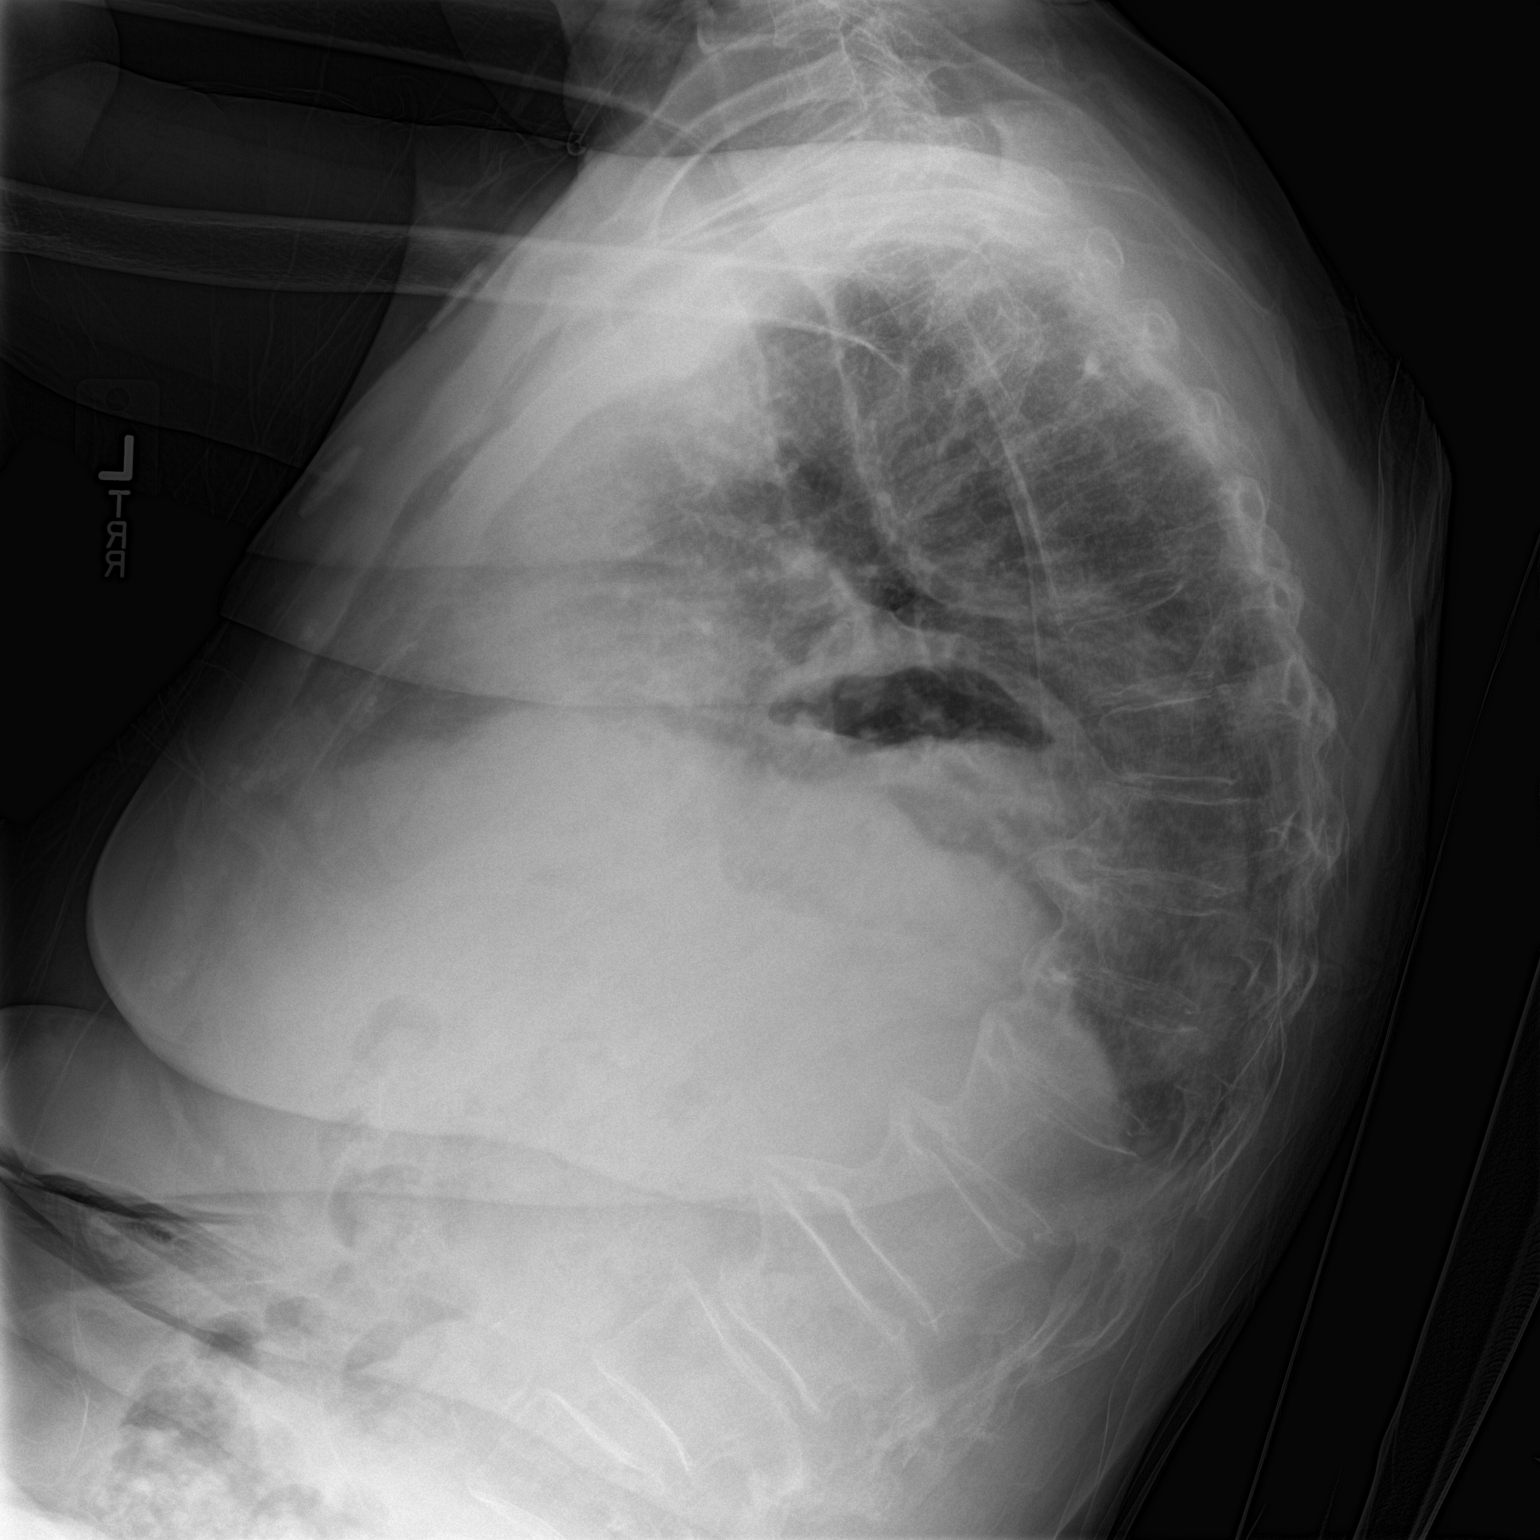
[im 2/2]
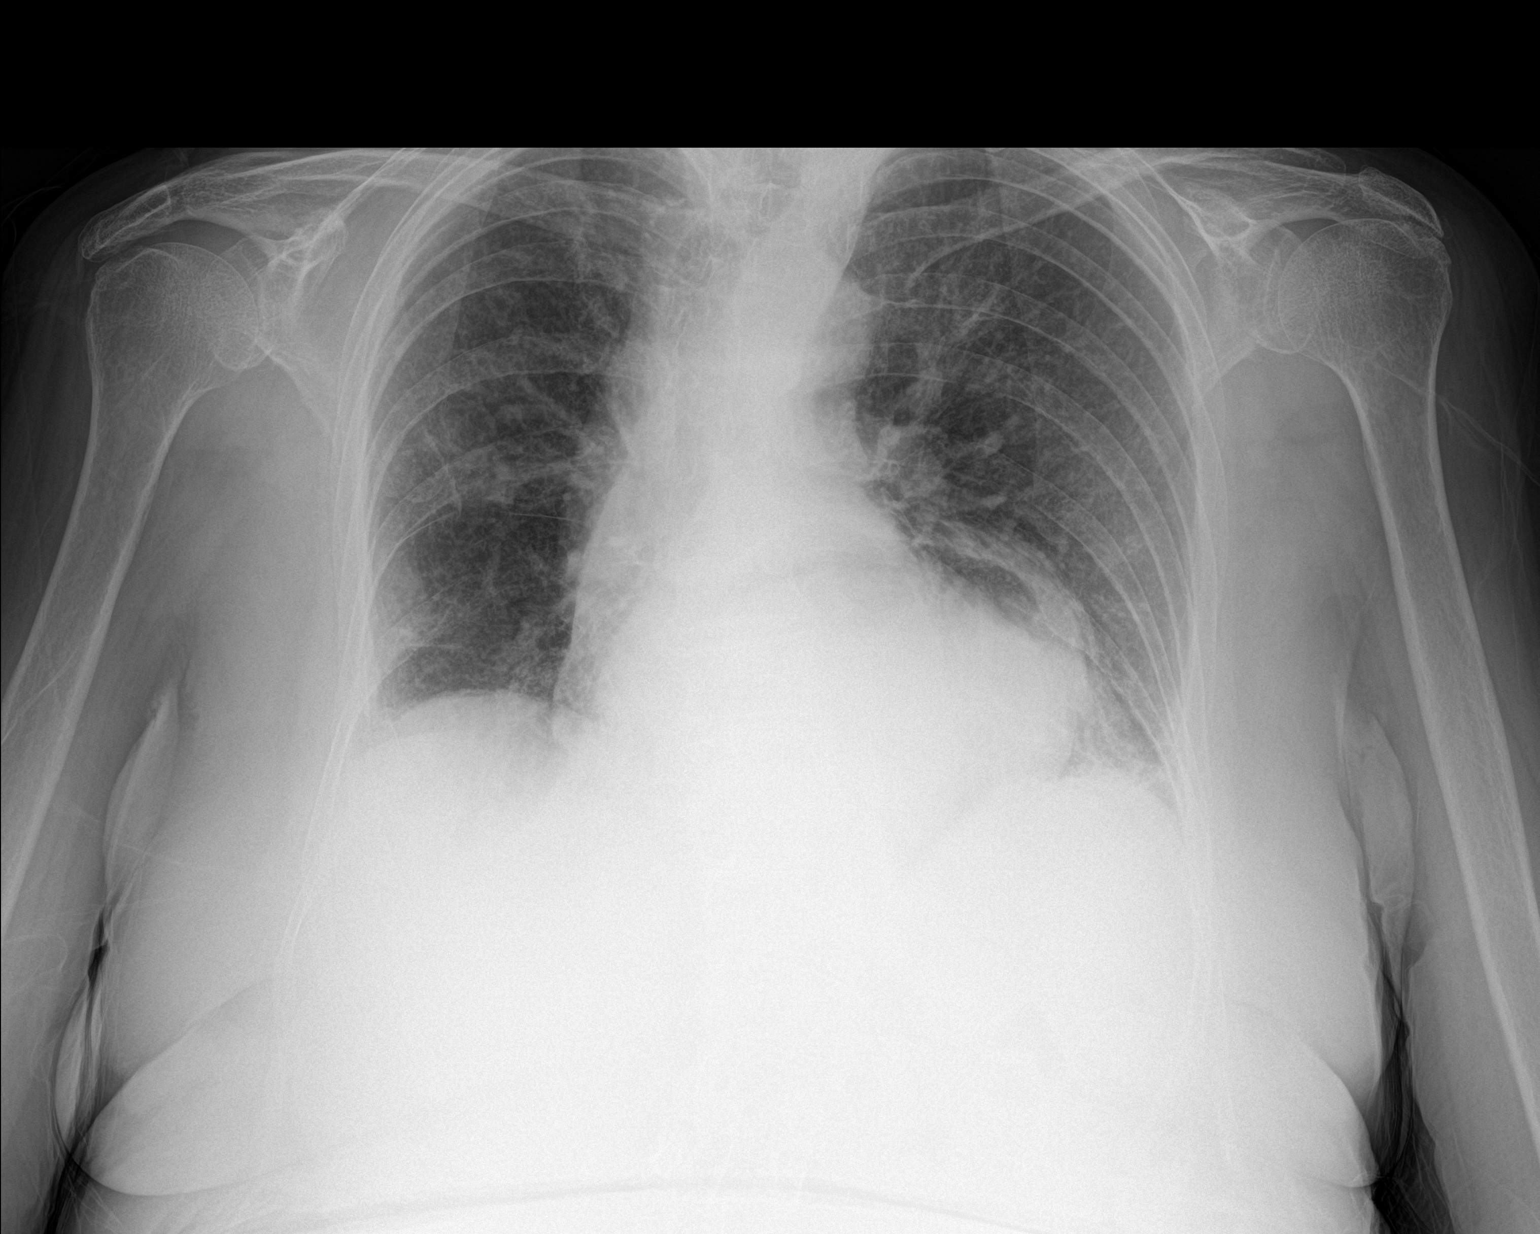

[2 of 2 positions shown; findings below may reference images not displayed]

FINDINGS: Stable cardiac and mediastinal contours. Large hiatal hernia. Low
lung volumes. Bibasilar coarse interstitial opacities. Slight
interval improvement in previously described right upper lobe
density. Likely chronic thickening of the right costophrenic angle.
No large effusion identified. Osseous demineralization with mid and
lower thoracic spine degenerative changes.
IMPRESSION: Slight interval improvement in previously described right upper lobe
likely subpleural process is nonspecific however may represent a
resolving infectious process. However, given that this abnormality
has persisted for multiple months, further evaluation with chest CT
is warranted.

## 2017-08-25 DIAGNOSIS — G309 Alzheimer's disease, unspecified: Secondary | ICD-10-CM

## 2017-08-25 DIAGNOSIS — F39 Unspecified mood [affective] disorder: Secondary | ICD-10-CM

## 2017-10-14 DIAGNOSIS — J309 Allergic rhinitis, unspecified: Secondary | ICD-10-CM | POA: Diagnosis not present

## 2017-10-14 DIAGNOSIS — G301 Alzheimer's disease with late onset: Secondary | ICD-10-CM | POA: Diagnosis not present

## 2017-10-14 DIAGNOSIS — F39 Unspecified mood [affective] disorder: Secondary | ICD-10-CM

## 2017-10-15 DIAGNOSIS — H10021 Other mucopurulent conjunctivitis, right eye: Secondary | ICD-10-CM

## 2017-12-17 DIAGNOSIS — G309 Alzheimer's disease, unspecified: Secondary | ICD-10-CM

## 2017-12-17 DIAGNOSIS — F39 Unspecified mood [affective] disorder: Secondary | ICD-10-CM

## 2018-02-04 DIAGNOSIS — J309 Allergic rhinitis, unspecified: Secondary | ICD-10-CM | POA: Diagnosis not present

## 2018-02-04 DIAGNOSIS — K5900 Constipation, unspecified: Secondary | ICD-10-CM

## 2018-02-04 DIAGNOSIS — F39 Unspecified mood [affective] disorder: Secondary | ICD-10-CM

## 2018-02-04 DIAGNOSIS — G301 Alzheimer's disease with late onset: Secondary | ICD-10-CM

## 2018-03-18 DIAGNOSIS — B351 Tinea unguium: Secondary | ICD-10-CM | POA: Diagnosis not present

## 2018-04-15 DIAGNOSIS — G309 Alzheimer's disease, unspecified: Secondary | ICD-10-CM | POA: Diagnosis not present

## 2018-04-15 DIAGNOSIS — F39 Unspecified mood [affective] disorder: Secondary | ICD-10-CM | POA: Diagnosis not present

## 2018-04-15 DIAGNOSIS — K5900 Constipation, unspecified: Secondary | ICD-10-CM | POA: Diagnosis not present

## 2018-06-13 DIAGNOSIS — J309 Allergic rhinitis, unspecified: Secondary | ICD-10-CM | POA: Diagnosis not present

## 2018-06-13 DIAGNOSIS — F39 Unspecified mood [affective] disorder: Secondary | ICD-10-CM | POA: Diagnosis not present

## 2018-06-13 DIAGNOSIS — G301 Alzheimer's disease with late onset: Secondary | ICD-10-CM | POA: Diagnosis not present

## 2018-06-13 DIAGNOSIS — K5900 Constipation, unspecified: Secondary | ICD-10-CM | POA: Diagnosis not present

## 2018-08-24 DIAGNOSIS — G309 Alzheimer's disease, unspecified: Secondary | ICD-10-CM | POA: Diagnosis not present

## 2018-08-24 DIAGNOSIS — F39 Unspecified mood [affective] disorder: Secondary | ICD-10-CM

## 2018-10-07 DIAGNOSIS — B351 Tinea unguium: Secondary | ICD-10-CM

## 2018-10-20 DIAGNOSIS — I480 Paroxysmal atrial fibrillation: Secondary | ICD-10-CM | POA: Diagnosis not present

## 2018-10-20 DIAGNOSIS — N6009 Solitary cyst of unspecified breast: Secondary | ICD-10-CM | POA: Diagnosis not present

## 2018-10-20 DIAGNOSIS — I5032 Chronic diastolic (congestive) heart failure: Secondary | ICD-10-CM

## 2018-10-20 DIAGNOSIS — M159 Polyosteoarthritis, unspecified: Secondary | ICD-10-CM

## 2018-10-20 DIAGNOSIS — I10 Essential (primary) hypertension: Secondary | ICD-10-CM | POA: Diagnosis not present

## 2018-12-07 DIAGNOSIS — G309 Alzheimer's disease, unspecified: Secondary | ICD-10-CM

## 2018-12-07 DIAGNOSIS — F39 Unspecified mood [affective] disorder: Secondary | ICD-10-CM

## 2019-02-14 DIAGNOSIS — J309 Allergic rhinitis, unspecified: Secondary | ICD-10-CM | POA: Diagnosis not present

## 2019-02-14 DIAGNOSIS — G301 Alzheimer's disease with late onset: Secondary | ICD-10-CM | POA: Diagnosis not present

## 2019-02-14 DIAGNOSIS — F39 Unspecified mood [affective] disorder: Secondary | ICD-10-CM | POA: Diagnosis not present

## 2019-04-26 DIAGNOSIS — F39 Unspecified mood [affective] disorder: Secondary | ICD-10-CM | POA: Diagnosis not present

## 2019-04-26 DIAGNOSIS — G309 Alzheimer's disease, unspecified: Secondary | ICD-10-CM

## 2019-06-13 DIAGNOSIS — J309 Allergic rhinitis, unspecified: Secondary | ICD-10-CM

## 2019-06-13 DIAGNOSIS — G301 Alzheimer's disease with late onset: Secondary | ICD-10-CM

## 2019-06-13 DIAGNOSIS — F39 Unspecified mood [affective] disorder: Secondary | ICD-10-CM

## 2019-08-25 DIAGNOSIS — N179 Acute kidney failure, unspecified: Secondary | ICD-10-CM | POA: Diagnosis not present

## 2019-08-25 DIAGNOSIS — G309 Alzheimer's disease, unspecified: Secondary | ICD-10-CM | POA: Diagnosis not present

## 2019-08-25 DIAGNOSIS — F39 Unspecified mood [affective] disorder: Secondary | ICD-10-CM | POA: Diagnosis not present

## 2019-10-19 DIAGNOSIS — F39 Unspecified mood [affective] disorder: Secondary | ICD-10-CM | POA: Diagnosis not present

## 2019-10-19 DIAGNOSIS — G301 Alzheimer's disease with late onset: Secondary | ICD-10-CM | POA: Diagnosis not present

## 2019-10-19 DIAGNOSIS — J309 Allergic rhinitis, unspecified: Secondary | ICD-10-CM | POA: Diagnosis not present

## 2019-12-04 DIAGNOSIS — E441 Mild protein-calorie malnutrition: Secondary | ICD-10-CM | POA: Diagnosis not present

## 2019-12-13 DIAGNOSIS — G309 Alzheimer's disease, unspecified: Secondary | ICD-10-CM | POA: Diagnosis not present

## 2019-12-13 DIAGNOSIS — F39 Unspecified mood [affective] disorder: Secondary | ICD-10-CM | POA: Diagnosis not present

## 2020-02-13 DIAGNOSIS — E441 Mild protein-calorie malnutrition: Secondary | ICD-10-CM | POA: Diagnosis not present

## 2020-02-13 DIAGNOSIS — G301 Alzheimer's disease with late onset: Secondary | ICD-10-CM | POA: Diagnosis not present

## 2020-02-13 DIAGNOSIS — F39 Unspecified mood [affective] disorder: Secondary | ICD-10-CM | POA: Diagnosis not present

## 2020-02-13 DIAGNOSIS — F062 Psychotic disorder with delusions due to known physiological condition: Secondary | ICD-10-CM | POA: Diagnosis not present

## 2020-04-04 DIAGNOSIS — R451 Restlessness and agitation: Secondary | ICD-10-CM | POA: Diagnosis not present

## 2020-04-19 DIAGNOSIS — E43 Unspecified severe protein-calorie malnutrition: Secondary | ICD-10-CM | POA: Diagnosis not present

## 2020-04-19 DIAGNOSIS — F39 Unspecified mood [affective] disorder: Secondary | ICD-10-CM

## 2020-04-19 DIAGNOSIS — G309 Alzheimer's disease, unspecified: Secondary | ICD-10-CM | POA: Diagnosis not present

## 2020-06-06 DIAGNOSIS — G301 Alzheimer's disease with late onset: Secondary | ICD-10-CM | POA: Diagnosis not present

## 2020-06-06 DIAGNOSIS — E441 Mild protein-calorie malnutrition: Secondary | ICD-10-CM

## 2020-06-06 DIAGNOSIS — F39 Unspecified mood [affective] disorder: Secondary | ICD-10-CM | POA: Diagnosis not present

## 2020-07-05 DIAGNOSIS — Z23 Encounter for immunization: Secondary | ICD-10-CM | POA: Diagnosis not present

## 2020-08-07 DIAGNOSIS — G309 Alzheimer's disease, unspecified: Secondary | ICD-10-CM | POA: Diagnosis not present

## 2020-08-07 DIAGNOSIS — F22 Delusional disorders: Secondary | ICD-10-CM | POA: Diagnosis not present

## 2020-08-07 DIAGNOSIS — E43 Unspecified severe protein-calorie malnutrition: Secondary | ICD-10-CM | POA: Diagnosis not present

## 2020-08-07 DIAGNOSIS — F39 Unspecified mood [affective] disorder: Secondary | ICD-10-CM | POA: Diagnosis not present

## 2020-10-28 DIAGNOSIS — E441 Mild protein-calorie malnutrition: Secondary | ICD-10-CM | POA: Diagnosis not present

## 2020-10-28 DIAGNOSIS — G301 Alzheimer's disease with late onset: Secondary | ICD-10-CM | POA: Diagnosis not present

## 2020-10-28 DIAGNOSIS — F39 Unspecified mood [affective] disorder: Secondary | ICD-10-CM | POA: Diagnosis not present

## 2020-12-03 DIAGNOSIS — Z961 Presence of intraocular lens: Secondary | ICD-10-CM | POA: Diagnosis not present

## 2020-12-11 DIAGNOSIS — F22 Delusional disorders: Secondary | ICD-10-CM | POA: Diagnosis not present

## 2020-12-11 DIAGNOSIS — E43 Unspecified severe protein-calorie malnutrition: Secondary | ICD-10-CM | POA: Diagnosis not present

## 2020-12-11 DIAGNOSIS — G309 Alzheimer's disease, unspecified: Secondary | ICD-10-CM | POA: Diagnosis not present

## 2020-12-11 DIAGNOSIS — F39 Unspecified mood [affective] disorder: Secondary | ICD-10-CM | POA: Diagnosis not present

## 2020-12-20 DIAGNOSIS — Z23 Encounter for immunization: Secondary | ICD-10-CM | POA: Diagnosis not present

## 2021-02-06 DIAGNOSIS — G301 Alzheimer's disease with late onset: Secondary | ICD-10-CM | POA: Diagnosis not present

## 2021-02-06 DIAGNOSIS — F39 Unspecified mood [affective] disorder: Secondary | ICD-10-CM | POA: Diagnosis not present

## 2021-02-06 DIAGNOSIS — E441 Mild protein-calorie malnutrition: Secondary | ICD-10-CM | POA: Diagnosis not present

## 2021-04-18 DIAGNOSIS — F39 Unspecified mood [affective] disorder: Secondary | ICD-10-CM | POA: Diagnosis not present

## 2021-04-18 DIAGNOSIS — E43 Unspecified severe protein-calorie malnutrition: Secondary | ICD-10-CM | POA: Diagnosis not present

## 2021-04-18 DIAGNOSIS — G309 Alzheimer's disease, unspecified: Secondary | ICD-10-CM | POA: Diagnosis not present

## 2021-04-25 DIAGNOSIS — Z23 Encounter for immunization: Secondary | ICD-10-CM | POA: Diagnosis not present

## 2021-06-05 DIAGNOSIS — F39 Unspecified mood [affective] disorder: Secondary | ICD-10-CM | POA: Diagnosis not present

## 2021-06-05 DIAGNOSIS — E441 Mild protein-calorie malnutrition: Secondary | ICD-10-CM | POA: Diagnosis not present

## 2021-06-05 DIAGNOSIS — G301 Alzheimer's disease with late onset: Secondary | ICD-10-CM | POA: Diagnosis not present

## 2021-08-15 DIAGNOSIS — G309 Alzheimer's disease, unspecified: Secondary | ICD-10-CM | POA: Diagnosis not present

## 2021-08-15 DIAGNOSIS — F39 Unspecified mood [affective] disorder: Secondary | ICD-10-CM | POA: Diagnosis not present

## 2021-08-15 DIAGNOSIS — E43 Unspecified severe protein-calorie malnutrition: Secondary | ICD-10-CM | POA: Diagnosis not present

## 2021-08-29 DIAGNOSIS — R21 Rash and other nonspecific skin eruption: Secondary | ICD-10-CM

## 2021-09-01 DIAGNOSIS — B029 Zoster without complications: Secondary | ICD-10-CM | POA: Diagnosis not present

## 2021-10-16 DIAGNOSIS — G301 Alzheimer's disease with late onset: Secondary | ICD-10-CM | POA: Diagnosis not present

## 2021-10-16 DIAGNOSIS — E441 Mild protein-calorie malnutrition: Secondary | ICD-10-CM | POA: Diagnosis not present

## 2021-10-16 DIAGNOSIS — F39 Unspecified mood [affective] disorder: Secondary | ICD-10-CM | POA: Diagnosis not present

## 2021-11-28 DIAGNOSIS — B351 Tinea unguium: Secondary | ICD-10-CM | POA: Diagnosis not present

## 2021-12-12 DIAGNOSIS — G309 Alzheimer's disease, unspecified: Secondary | ICD-10-CM | POA: Diagnosis not present

## 2021-12-12 DIAGNOSIS — F39 Unspecified mood [affective] disorder: Secondary | ICD-10-CM | POA: Diagnosis not present

## 2021-12-12 DIAGNOSIS — E43 Unspecified severe protein-calorie malnutrition: Secondary | ICD-10-CM | POA: Diagnosis not present

## 2022-02-05 DIAGNOSIS — G301 Alzheimer's disease with late onset: Secondary | ICD-10-CM | POA: Diagnosis not present

## 2022-02-05 DIAGNOSIS — E441 Mild protein-calorie malnutrition: Secondary | ICD-10-CM | POA: Diagnosis not present

## 2022-02-05 DIAGNOSIS — J309 Allergic rhinitis, unspecified: Secondary | ICD-10-CM | POA: Diagnosis not present

## 2022-02-05 DIAGNOSIS — F39 Unspecified mood [affective] disorder: Secondary | ICD-10-CM | POA: Diagnosis not present

## 2022-03-13 DIAGNOSIS — B950 Streptococcus, group A, as the cause of diseases classified elsewhere: Secondary | ICD-10-CM | POA: Diagnosis not present

## 2022-04-02 DIAGNOSIS — H109 Unspecified conjunctivitis: Secondary | ICD-10-CM

## 2022-04-13 DIAGNOSIS — F39 Unspecified mood [affective] disorder: Secondary | ICD-10-CM

## 2022-04-13 DIAGNOSIS — J309 Allergic rhinitis, unspecified: Secondary | ICD-10-CM

## 2022-04-13 DIAGNOSIS — E441 Mild protein-calorie malnutrition: Secondary | ICD-10-CM

## 2022-04-13 DIAGNOSIS — G301 Alzheimer's disease with late onset: Secondary | ICD-10-CM

## 2022-05-21 ENCOUNTER — Encounter: Payer: Self-pay | Admitting: Nurse Practitioner

## 2022-05-21 ENCOUNTER — Non-Acute Institutional Stay (SKILLED_NURSING_FACILITY): Payer: Medicare Other | Admitting: Nurse Practitioner

## 2022-05-21 DIAGNOSIS — G309 Alzheimer's disease, unspecified: Secondary | ICD-10-CM

## 2022-05-21 DIAGNOSIS — R634 Abnormal weight loss: Secondary | ICD-10-CM | POA: Diagnosis not present

## 2022-05-21 DIAGNOSIS — Z66 Do not resuscitate: Secondary | ICD-10-CM

## 2022-05-21 DIAGNOSIS — J309 Allergic rhinitis, unspecified: Secondary | ICD-10-CM

## 2022-05-21 DIAGNOSIS — I10 Essential (primary) hypertension: Secondary | ICD-10-CM

## 2022-05-21 DIAGNOSIS — F39 Unspecified mood [affective] disorder: Secondary | ICD-10-CM

## 2022-05-21 DIAGNOSIS — F02C0 Dementia in other diseases classified elsewhere, severe, without behavioral disturbance, psychotic disturbance, mood disturbance, and anxiety: Secondary | ICD-10-CM | POA: Diagnosis not present

## 2022-05-21 NOTE — Progress Notes (Signed)
Location:  Other [facility]) Nursing Home Room Number: [id number] Place of Service:  SNF [phone]) Provider: Carlos American. Dewaine Oats, NP   Patient Care Team: Dewayne Shorter, MD as PCP - General [facility] Medicine)  Extended Emergency Contact Information Primary Emergency Contact: Milledge,MICHAEL W Address: [address]          [place], [place] [address] Home Phone: [phone] Relation: None  Code Status:  DNR Goals of care: Advanced Directive information    03/06/2015    7:19 PM  Advanced Directives  Does Patient Have a Medical Advance Directive? Yes     Chief Complaint  Patient presents with   Medical Management of Chronic Issues    Routine follow-up. Discuss need for covid boosters, td/tdap, shingrix, PCV, Dexa and flu vaccine or post pone if patient refuses.     HPI:  Pt is a 87 y.o. female seen today for medical management of chronic diseases.   Pt is long term [facility] at [facility].  Pt with advanced dementia, needing total care from staff. There has been no acute changes. Staff notes toenails are long and needing to be trimmed.  She has had ~3 lbs weight loss in the last 3 months.  She is wheelchair bound and minimally verbal.   Past Medical History:  Diagnosis Date   Anxiety    Dementia ([place])    Hyperlipemia    Hypertension    Pneumonia    Urinary retention    UTI (lower urinary tract infection)    Past Surgical History:  Procedure Laterality Date   ABDOMINAL HYSTERECTOMY     CATARACT EXTRACTION      No Known Allergies  Outpatient Encounter Medications as of 05/21/2022  Medication Sig   acetaminophen (TYLENOL) 325 MG tablet Take 650 mg by mouth every 4 (four) hours as needed.   acetaminophen (TYLENOL) 500 MG tablet Take 500 mg by mouth every 6 (six) hours as needed.   bismuth subsalicylate (PEPTO BISMOL) 262 MG/15ML suspension Take 30 mLs by mouth daily as needed.   Infant Care Products [facility]) OINT Apply 1 application  topically  every 8 (eight) hours as needed (to periarea/breasts).   ketoconazole (NIZORAL) 2 % shampoo Apply 1 Application topically 2 (two) times a week. On Tuesday and Thursday for dry scalp   loratadine (CLARITIN) 10 MG tablet Take 10 mg by mouth daily.   magnesium hydroxide (MILK OF MAGNESIA) 400 MG/5ML suspension Take 30 mLs by mouth at bedtime as needed for mild constipation.   memantine (NAMENDA XR) 28 MG CP24 24 hr capsule Take 28 mg by mouth every morning.    nystatin [facility]) powder Apply 1 Application topically every 12 (twelve) hours as needed.   Olopatadine HCl (PATADAY) 0.2 % SOLN Place 1 drop into both eyes every morning.   ondansetron (ZOFRAN) 4 MG tablet Take 4 mg by mouth every 8 (eight) hours as needed for nausea or vomiting.   sertraline (ZOLOFT) 50 MG tablet Take 50 mg by mouth daily.   UNABLE TO FIND Med Name: Medpass three times daily for supplement   [DISCONTINUED] alum & mag hydroxide-simeth (MAALOX PLUS) 400-400-40 MG/5ML suspension Take 5 mLs by mouth every 6 (six) hours as needed for indigestion.   [DISCONTINUED] cefUROXime (CEFTIN) 250 MG tablet Take by mouth.   [DISCONTINUED] cetirizine (ZYRTEC) 10 MG tablet Take 10 mg by mouth daily.   [DISCONTINUED] ciprofloxacin (CIPRO) 500 MG tablet Take 1 tablet (500 mg total) by mouth 2 (two) times daily.   [DISCONTINUED]  guaiFENesin (MUCINEX) 600 MG 12 hr tablet Take 600 mg by mouth 2 (two) times daily.   [DISCONTINUED] guaiFENesin (ROBITUSSIN) 100 MG/5ML liquid Take 200 mg by mouth 4 (four) times daily as needed for cough.   [DISCONTINUED] loperamide (IMODIUM) 2 MG capsule Take 2 mg by mouth every 3 (three) hours as needed for diarrhea or loose stools.   [DISCONTINUED] LORazepam (ATIVAN) 0.5 MG tablet Take by mouth.   [DISCONTINUED] mirtazapine (REMERON) 15 MG tablet Take 15 mg by mouth at bedtime.   [DISCONTINUED] NEOMYCIN-POLYMYXIN-HYDROCORTISONE (CORTISPORIN) 1 % SOLN otic solution Three drops four times a day for 7 days.    [DISCONTINUED] sertraline (ZOLOFT) 25 MG tablet Take 25 mg by mouth daily.   [DISCONTINUED] vitamin B-12 (CYANOCOBALAMIN) 1000 MCG tablet Take 1,000 mcg by mouth daily.   No facility-administered encounter medications on file as of 05/21/2022.    Review of Systems  Unable to perform ROS: Dementia    Immunization History  Administered Date(s) Administered   Influenza Whole 08/14/2014   Influenza-Unspecified 06/17/2011, 04/28/2012, 06/15/2013, 04/27/2014   Moderna Covid-19 Vaccine Bivalent Booster 63yrs & up 12/30/2021   Moderna SARS-COV2 Booster Vaccination 07/05/2020, 12/20/2021   Moderna Sars-Covid-2 Vaccination 10/13/2019, 11/21/2019   Pfizer Covid-19 Vaccine Bivalent Booster 67yrs & up 04/25/2021   Pneumococcal Conjugate-13 08/21/2014   Pneumococcal Polysaccharide-23 03/09/2012, 08/04/2015   Tdap 02/10/2020   Pertinent  Health Maintenance Due  Topic Date Due   DEXA SCAN  Never done   INFLUENZA VACCINE  03/03/2022       No data to display         Functional Status Survey:    Vitals:   05/21/22 1200  BP: 127/73  Pulse: 69  Resp: 18  Temp: (!) 97.2 F (36.2 C)  SpO2: 94%  Weight: 136 lb (61.7 kg)  Height: 5\' 6"  (1.676 m)   Body mass index is 21.95 kg/m. Physical Exam Constitutional:      General: She is not in acute distress.    Appearance: She is well-developed. She is not diaphoretic.  HENT:     Head: Normocephalic and atraumatic.     Mouth/Throat:     Pharynx: No oropharyngeal exudate.  Eyes:     Conjunctiva/sclera: Conjunctivae normal.     Pupils: Pupils are equal, round, and reactive to light.  Cardiovascular:     Rate and Rhythm: Normal rate and regular rhythm.     Heart sounds: Normal heart sounds.  Pulmonary:     Effort: Pulmonary effort is normal.     Breath sounds: Normal breath sounds.  Abdominal:     General: Bowel sounds are normal.     Palpations: Abdomen is soft.  Musculoskeletal:     Cervical back: Normal range of motion and neck  supple.     Right lower leg: No edema.     Left lower leg: No edema.  Feet:     Right foot:     Toenail Condition: Right toenails are long.     Left foot:     Toenail Condition: Left toenails are long.  Skin:    General: Skin is warm and dry.  Neurological:     Mental Status: She is alert.  Psychiatric:        Mood and Affect: Mood normal.     Labs reviewed: No results for input(s): "NA", "K", "CL", "CO2", "GLUCOSE", "BUN", "CREATININE", "CALCIUM", "MG", "PHOS" in the last 8760 hours. No results for input(s): "AST", "ALT", "ALKPHOS", "BILITOT", "PROT", "ALBUMIN" in the last 8760  hours. No results for input(s): "WBC", "NEUTROABS", "HGB", "HCT", "MCV", "PLT" in the last 8760 hours. Lab Results  Component Value Date   TSH 1.77 10/17/2012   No results found for: "HGBA1C" No results found for: "CHOL", "HDL", "LDLCALC", "LDLDIRECT", "TRIG", "CHOLHDL"  Significant Diagnostic Results in last 30 days:  No results found.  Assessment/Plan 1. Primary hypertension -Blood pressure well controlled, goal bp <140/90 Continue current medications and dietary modifications follow metabolic panel  2. Severe Alzheimer's dementia without behavioral disturbance, psychotic disturbance, mood disturbance, or anxiety, unspecified timing of dementia onset ([place]) -Stable, no acute changes in cognitive or functional status, continue supportive care.   3. Do not resuscitate Per [facility] records - Do not attempt resuscitation (DNR)  4. Allergic rhinitis, unspecified seasonality, unspecified trigger Stable, continues on claritin daily  5. Mood disorder ([place]) -well controlled on zoloft daily  6. Weight loss -noted over the last 3 months, expect to worsen with progression of dementia. Continue supportive care.   Carlos American. [place], [facility] [phone]

## 2022-06-12 DIAGNOSIS — Z23 Encounter for immunization: Secondary | ICD-10-CM | POA: Diagnosis not present

## 2022-07-21 ENCOUNTER — Encounter: Payer: Self-pay | Admitting: Nurse Practitioner

## 2022-07-21 ENCOUNTER — Non-Acute Institutional Stay (SKILLED_NURSING_FACILITY): Payer: Medicare Other | Admitting: Nurse Practitioner

## 2022-07-21 DIAGNOSIS — R0902 Hypoxemia: Secondary | ICD-10-CM | POA: Diagnosis not present

## 2022-07-21 DIAGNOSIS — R0989 Other specified symptoms and signs involving the circulatory and respiratory systems: Secondary | ICD-10-CM | POA: Diagnosis not present

## 2022-07-21 DIAGNOSIS — R059 Cough, unspecified: Secondary | ICD-10-CM | POA: Diagnosis not present

## 2022-07-21 DIAGNOSIS — I0981 Rheumatic heart failure: Secondary | ICD-10-CM | POA: Diagnosis not present

## 2022-07-21 DIAGNOSIS — I517 Cardiomegaly: Secondary | ICD-10-CM | POA: Diagnosis not present

## 2022-07-21 NOTE — Progress Notes (Signed)
Location:   [facility] Room Number: 417 A Place of Service:  SNF [phone]) Provider:  Abbey Chatters, NP  Earnestine Mealing, MD  Patient Care Team: Earnestine Mealing, MD as PCP - General (Family Medicine)  Extended Emergency Contact Information Primary Emergency Contact: Kozinski,MICHAEL W Address: [address]          [place], [place] [address] Home Phone: [phone] Relation: None  Code Status:  DNR Goals of care: Advanced Directive information    07/21/2022    2:11 PM  Advanced Directives  Does Patient Have a Medical Advance Directive? Yes  Type of Estate agent of [place];Out of facility DNR (pink MOST or yellow form)  Does patient want to make changes to medical advance directive? No - Guardian declined  Copy of Healthcare Power of Attorney in Chart? Yes - validated most recent copy scanned in chart (See row information)  Pre-existing out of facility DNR order (yellow form or pink MOST form) Yellow form placed in chart (order not valid for inpatient use)     Chief Complaint  Patient presents with   Acute Visit    Chest congestion    HPI:  Pt is a 87 y.o. female seen today for an acute visit for increase in cough and chest congestion.  Staff reports she had a deep cough with increase chest congestion today. O2 sats dropped and they applied O2.  COVID and RSV test were negative.  No fevers.  Pt with dementia and baseline sleeps a lot through the day.    Past Medical History:  Diagnosis Date   Anxiety    Dementia (HCC)    Hyperlipemia    Hypertension    Pneumonia    Urinary retention    UTI (lower urinary tract infection)    Past Surgical History:  Procedure Laterality Date   ABDOMINAL HYSTERECTOMY     CATARACT EXTRACTION      No Known Allergies  Allergies as of 07/21/2022   No Known Allergies      Medication List        Accurate as of July 21, 2022  3:09 PM. If you have any  questions, ask your nurse or doctor.          STOP taking these medications    UNABLE TO FIND Stopped by: Sharon Seller, NP       TAKE these medications    acetaminophen 500 MG tablet Commonly known as: TYLENOL Take 500 mg by mouth every 6 (six) hours as needed.   acetaminophen 325 MG tablet Commonly known as: TYLENOL Take 650 mg by mouth every 4 (four) hours as needed.   bisacodyl 10 MG suppository Commonly known as: DULCOLAX Place 10 mg rectally daily as needed for moderate constipation.   bismuth subsalicylate 262 MG/15ML suspension Commonly known as: PEPTO BISMOL Take 30 mLs by mouth daily as needed.   CHLORHEXIDINE GLUCONATE (BULK) Soln 1 Swab by Does not apply route in the morning and at bedtime.   Dermacloud Oint Apply 1 application  topically every 8 (eight) hours as needed (to periarea/breasts).   ketoconazole 2 % shampoo Commonly known as: NIZORAL Apply 1 Application topically 2 (two) times a week. On Tuesday and Thursday for dry scalp   loratadine 10 MG tablet Commonly known as: CLARITIN Take 10 mg by mouth daily.   magnesium hydroxide 400 MG/5ML suspension Commonly known as: MILK OF MAGNESIA Take 30 mLs by mouth at bedtime as needed for mild constipation.  memantine 28 MG Cp24 24 hr capsule Commonly known as: NAMENDA XR Take 28 mg by mouth every morning.   Nyamyc powder Generic drug: nystatin Apply 1 Application topically every 12 (twelve) hours as needed.   Olopatadine HCl 0.2 % Soln Commonly known as: Pataday Place 1 drop into both eyes every morning.   ondansetron 4 MG tablet Commonly known as: ZOFRAN Take 4 mg by mouth every 8 (eight) hours as needed for nausea or vomiting.   Senna Plus 8.6-50 MG tablet Generic drug: senna-docusate Take 2 tablets by mouth 2 (two) times daily.   sertraline 50 MG tablet Commonly known as: ZOLOFT Take 50 mg by mouth daily.        Review of Systems  Unable to perform ROS: Dementia     Immunization History  Administered Date(s) Administered   Influenza Whole 08/14/2014   Influenza-Unspecified 06/17/2011, 04/28/2012, 06/15/2013, 04/27/2014   Moderna Covid-19 Vaccine Bivalent Booster 35yrs & up 12/30/2021   Moderna SARS-COV2 Booster Vaccination 07/05/2020, 12/20/2021   Moderna Sars-Covid-2 Vaccination 10/13/2019, 11/21/2019   Pfizer Covid-19 Vaccine Bivalent Booster 33yrs & up 04/25/2021   Pneumococcal Conjugate-13 08/21/2014   Pneumococcal Polysaccharide-23 03/09/2012, 08/04/2015   Tdap 02/10/2020   Pertinent  Health Maintenance Due  Topic Date Due   DEXA SCAN  Never done   INFLUENZA VACCINE  03/03/2022       No data to display         Functional Status Survey:    Vitals:   07/21/22 1409  BP: 114/68  Pulse: 91  Resp: 18  Temp: 97.6 F (36.4 C)  SpO2: 94%  Weight: 136 lb 6.4 oz (61.9 kg)  Height: 5\' 6"  (1.676 m)   Body mass index is 22.02 kg/m. Physical Exam Constitutional:      General: She is not in acute distress.    Appearance: She is well-developed. She is not diaphoretic.  HENT:     Head: Normocephalic and atraumatic.     Mouth/Throat:     Pharynx: No oropharyngeal exudate.  Eyes:     Conjunctiva/sclera: Conjunctivae normal.     Pupils: Pupils are equal, round, and reactive to light.  Cardiovascular:     Rate and Rhythm: Normal rate and regular rhythm.     Heart sounds: Normal heart sounds.  Pulmonary:     Effort: Pulmonary effort is normal.     Breath sounds: Examination of the right-lower field reveals decreased breath sounds. Examination of the left-lower field reveals decreased breath sounds. Decreased breath sounds present.  Abdominal:     General: Bowel sounds are normal.     Palpations: Abdomen is soft.  Musculoskeletal:     Cervical back: Normal range of motion and neck supple.     Right lower leg: No edema.     Left lower leg: No edema.  Skin:    General: Skin is warm and dry.  Neurological:     Mental Status: She  is lethargic.  Psychiatric:        Mood and Affect: Mood normal.     Labs reviewed: No results for input(s): "NA", "K", "CL", "CO2", "GLUCOSE", "BUN", "CREATININE", "CALCIUM", "MG", "PHOS" in the last 8760 hours. No results for input(s): "AST", "ALT", "ALKPHOS", "BILITOT", "PROT", "ALBUMIN" in the last 8760 hours. No results for input(s): "WBC", "NEUTROABS", "HGB", "HCT", "MCV", "PLT" in the last 8760 hours. Lab Results  Component Value Date   TSH 1.77 10/17/2012   No results found for: "HGBA1C" No results found for: "CHOL", "HDL", "LDLCALC", "  LDLDIRECT", "TRIG", "CHOLHDL"  Significant Diagnostic Results in last 30 days:  No results found.  Assessment/Plan 1. Hypoxia Continue O2 at 2L to keep sats over 92%  2. Chest congestion -will get chest xray, cbc, bmp at this time for further evaluation -start azithromycin 500 mg today followed by 250 daily for 4 days.  -encourage hydration and deep breathing.  -notify for change.   Janene Harvey. [facility] [phone]

## 2022-07-23 DIAGNOSIS — I10 Essential (primary) hypertension: Secondary | ICD-10-CM | POA: Diagnosis not present

## 2022-07-23 LAB — CBC AND DIFFERENTIAL
HCT: 47 — AB (ref 36–46)
Hemoglobin: 15.7 (ref 12.0–16.0)
Neutrophils Absolute: 6361
Platelets: 115 10*3/uL — AB (ref 150–400)
WBC: 9.1

## 2022-07-23 LAB — CBC: RBC: 4.93 (ref 3.87–5.11)

## 2022-07-23 LAB — BASIC METABOLIC PANEL
BUN: 32 — AB (ref 4–21)
CO2: 25 — AB (ref 13–22)
Chloride: 122 — AB (ref 99–108)
Creatinine: 1.3 — AB (ref 0.5–1.1)
Glucose: 101
Potassium: 3.9 mEq/L (ref 3.5–5.1)
Sodium: 157 — AB (ref 137–147)

## 2022-07-23 LAB — COMPREHENSIVE METABOLIC PANEL
Calcium: 8.4 — AB (ref 8.7–10.7)
eGFR: 40

## 2022-07-30 ENCOUNTER — Encounter: Payer: Self-pay | Admitting: Nurse Practitioner

## 2022-07-30 ENCOUNTER — Non-Acute Institutional Stay (SKILLED_NURSING_FACILITY): Payer: Medicare Other | Admitting: Nurse Practitioner

## 2022-07-30 DIAGNOSIS — F02C0 Dementia in other diseases classified elsewhere, severe, without behavioral disturbance, psychotic disturbance, mood disturbance, and anxiety: Secondary | ICD-10-CM

## 2022-07-30 DIAGNOSIS — K625 Hemorrhage of anus and rectum: Secondary | ICD-10-CM | POA: Diagnosis not present

## 2022-07-30 DIAGNOSIS — Z7189 Other specified counseling: Secondary | ICD-10-CM

## 2022-07-30 DIAGNOSIS — E870 Hyperosmolality and hypernatremia: Secondary | ICD-10-CM | POA: Diagnosis not present

## 2022-07-30 DIAGNOSIS — G309 Alzheimer's disease, unspecified: Secondary | ICD-10-CM

## 2022-07-30 DIAGNOSIS — D649 Anemia, unspecified: Secondary | ICD-10-CM | POA: Diagnosis not present

## 2022-07-30 LAB — BASIC METABOLIC PANEL
BUN: 31 — AB (ref 4–21)
CO2: 27 — AB (ref 13–22)
Chloride: 123 — AB (ref 99–108)
Creatinine: 1.4 — AB (ref 0.5–1.1)
Glucose: 117
Potassium: 3.6 mEq/L (ref 3.5–5.1)
Sodium: 157 — AB (ref 137–147)

## 2022-07-30 LAB — CBC AND DIFFERENTIAL
HCT: 45 (ref 36–46)
Hemoglobin: 14.9 (ref 12.0–16.0)
Neutrophils Absolute: 7049
Platelets: 100 10*3/uL — AB (ref 150–400)
WBC: 9.3

## 2022-07-30 LAB — CBC: RBC: 4.75 (ref 3.87–5.11)

## 2022-07-30 LAB — COMPREHENSIVE METABOLIC PANEL
Calcium: 8.6 — AB (ref 8.7–10.7)
eGFR: 36

## 2022-07-30 NOTE — Progress Notes (Signed)
Location:  Other Nursing Home Room Number: 417 A Place of Service:  SNF ([phone])  Earnestine Mealing, MD  Patient Care Team: Earnestine Mealing, MD as PCP - General [facility] Medicine)  Extended Emergency Contact Information Primary Emergency Contact: Wheeless,MICHAEL W Address: [address]          [place], [place] [address] Home Phone: [phone] Relation: None  Goals of care: Advanced Directive information    07/30/2022    4:22 PM  Advanced Directives  Does Patient Have a Medical Advance Directive? Yes  Type of Estate agent of [place];Out of facility DNR (pink MOST or yellow form)  Does patient want to make changes to medical advance directive? No - Patient declined  Copy of Healthcare Power of Attorney in Chart? Yes - validated most recent copy scanned in chart (See row information)  Pre-existing out of facility DNR order (yellow form or pink MOST form) Yellow form placed in chart (order not valid for inpatient use)     Chief Complaint  Patient presents with   Acute Visit    Rectal bleeding    HPI:  Pt is a 87 y.o. female seen today for an acute visit for rectal bleeding. Pt with hx of dementia. She had congestion and cough earlier this month and labs revealed CKD with hypernatremia.  Today staff went to change her brief and noticed a large amount of blood, reports she was having some blood in her urine earlier this week but this had resolved.  She has advanced dementia and unable to provide hx.  Staff reports she is weaker than usual.  Her son was called to discuss goals of care.    Past Medical History:  Diagnosis Date   Anxiety    Dementia (HCC)    Hyperlipemia    Hypertension    Pneumonia    Urinary retention    UTI (lower urinary tract infection)    Past Surgical History:  Procedure Laterality Date   ABDOMINAL HYSTERECTOMY     CATARACT EXTRACTION      No Known Allergies  Outpatient Encounter Medications as of 07/30/2022   Medication Sig   acetaminophen (TYLENOL) 325 MG tablet Take 650 mg by mouth every 4 (four) hours as needed.   acetaminophen (TYLENOL) 500 MG tablet Take 500 mg by mouth every 6 (six) hours as needed.   bisacodyl (DULCOLAX) 10 MG suppository Place 10 mg rectally daily as needed for moderate constipation.   bismuth subsalicylate (PEPTO BISMOL) 262 MG/15ML suspension Take 30 mLs by mouth daily as needed.   CHLORHEXIDINE GLUCONATE, BULK, SOLN 1 Swab by Does not apply route in the morning and at bedtime.   Infant Care Products [facility]) OINT Apply 1 application  topically every 8 (eight) hours as needed (to periarea/breasts).   ketoconazole (NIZORAL) 2 % shampoo Apply 1 Application topically 2 (two) times a week. On Tuesday and Thursday for dry scalp   loratadine (CLARITIN) 10 MG tablet Take 10 mg by mouth daily.   magnesium hydroxide (MILK OF MAGNESIA) 400 MG/5ML suspension Take 30 mLs by mouth at bedtime as needed for mild constipation.   memantine (NAMENDA XR) 28 MG CP24 24 hr capsule Take 28 mg by mouth every morning.    nystatin [facility]) powder Apply 1 Application topically every 12 (twelve) hours as needed.   Olopatadine HCl (PATADAY) 0.2 % SOLN Place 1 drop into both eyes every morning.   ondansetron (ZOFRAN) 4 MG tablet Take 4 mg by mouth every 8 (eight) hours as  needed for nausea or vomiting.   senna-docusate (SENNA PLUS) 8.6-50 MG tablet Take 2 tablets by mouth 2 (two) times daily.   sertraline (ZOLOFT) 50 MG tablet Take 50 mg by mouth daily.   No facility-administered encounter medications on file as of 07/30/2022.    Review of Systems  Unable to perform ROS: Dementia    Immunization History  Administered Date(s) Administered   Influenza Whole 08/14/2014   Influenza-Unspecified 06/17/2011, 04/28/2012, 06/15/2013, 04/27/2014   Moderna Covid-19 Vaccine Bivalent Booster 54yrs & up 12/30/2021   Moderna SARS-COV2 Booster Vaccination 07/05/2020, 12/20/2021   Moderna Sars-Covid-2  Vaccination 10/13/2019, 11/21/2019   Pfizer Covid-19 Vaccine Bivalent Booster 53yrs & up 04/25/2021   Pneumococcal Conjugate-13 08/21/2014   Pneumococcal Polysaccharide-23 03/09/2012, 08/04/2015   Tdap 02/10/2020   Pertinent  Health Maintenance Due  Topic Date Due   DEXA SCAN  Never done   INFLUENZA VACCINE  03/03/2022       No data to display         Functional Status Survey:    Vitals:   07/30/22 1619  BP: 106/68  Pulse: 73  Resp: (!) 22  Temp: (!) 97.5 F (36.4 C)  SpO2: 93%  Weight: 136 lb 6.4 oz (61.9 kg)  Height: 5\' 6"  (1.676 m)   Body mass index is 22.02 kg/m. Physical Exam Constitutional:      General: She is not in acute distress.    Appearance: She is well-developed. She is not diaphoretic.  HENT:     Head: Normocephalic and atraumatic.     Mouth/Throat:     Pharynx: No oropharyngeal exudate.  Eyes:     Conjunctiva/sclera: Conjunctivae normal.     Pupils: Pupils are equal, round, and reactive to light.  Cardiovascular:     Rate and Rhythm: Regular rhythm. Tachycardia present.     Heart sounds: Normal heart sounds.  Pulmonary:     Effort: Pulmonary effort is normal.     Breath sounds: Normal breath sounds.  Abdominal:     General: Bowel sounds are normal.     Palpations: Abdomen is soft.  Genitourinary:    Comments: Bleeding from rectum noted  Musculoskeletal:     Cervical back: Normal range of motion and neck supple.     Right lower leg: No edema.     Left lower leg: No edema.  Skin:    General: Skin is warm and dry.  Neurological:     Mental Status: She is alert.     Labs reviewed: No results for input(s): "NA", "K", "CL", "CO2", "GLUCOSE", "BUN", "CREATININE", "CALCIUM", "MG", "PHOS" in the last 8760 hours. No results for input(s): "AST", "ALT", "ALKPHOS", "BILITOT", "PROT", "ALBUMIN" in the last 8760 hours. No results for input(s): "WBC", "NEUTROABS", "HGB", "HCT", "MCV", "PLT" in the last 8760 hours. Lab Results  Component Value  Date   TSH 1.77 10/17/2012   No results found for: "HGBA1C" No results found for: "CHOL", "HDL", "LDLCALC", "LDLDIRECT", "TRIG", "CHOLHDL"  Significant Diagnostic Results in last 30 days:  No results found.  Assessment/Plan 1. Rectal bleeding BRBPR noted, called son to discuss and he does not want her sent to the hospital. Primary focus on comfort at this time due to her advanced dementia.  - Ambulatory referral to Hospice  2. Severe Alzheimer's dementia without behavioral disturbance, psychotic disturbance, mood disturbance, or anxiety, unspecified timing of dementia onset (HCC) Advance dementia, continue with support care per staff - Ambulatory referral to Hospice  3. Hypernatremia Ongoing noted on labs. Son  does not wish for hospitalization and comfort approach.  - Ambulatory referral to [facility]. [facility]ag[facility] [phone]

## 2022-07-31 DIAGNOSIS — R0602 Shortness of breath: Secondary | ICD-10-CM | POA: Diagnosis not present

## 2022-07-31 DIAGNOSIS — F0283 Dementia in other diseases classified elsewhere, unspecified severity, with mood disturbance: Secondary | ICD-10-CM | POA: Diagnosis not present

## 2022-07-31 DIAGNOSIS — M24541 Contracture, right hand: Secondary | ICD-10-CM | POA: Diagnosis not present

## 2022-07-31 DIAGNOSIS — M24542 Contracture, left hand: Secondary | ICD-10-CM | POA: Diagnosis not present

## 2022-07-31 DIAGNOSIS — E785 Hyperlipidemia, unspecified: Secondary | ICD-10-CM | POA: Diagnosis not present

## 2022-07-31 DIAGNOSIS — I10 Essential (primary) hypertension: Secondary | ICD-10-CM | POA: Diagnosis not present

## 2022-07-31 DIAGNOSIS — Z9981 Dependence on supplemental oxygen: Secondary | ICD-10-CM | POA: Diagnosis not present

## 2022-07-31 DIAGNOSIS — G309 Alzheimer's disease, unspecified: Secondary | ICD-10-CM | POA: Diagnosis not present

## 2022-07-31 DIAGNOSIS — J302 Other seasonal allergic rhinitis: Secondary | ICD-10-CM | POA: Diagnosis not present

## 2022-08-01 DIAGNOSIS — I10 Essential (primary) hypertension: Secondary | ICD-10-CM | POA: Diagnosis not present

## 2022-08-01 DIAGNOSIS — G309 Alzheimer's disease, unspecified: Secondary | ICD-10-CM | POA: Diagnosis not present

## 2022-08-01 DIAGNOSIS — M24541 Contracture, right hand: Secondary | ICD-10-CM | POA: Diagnosis not present

## 2022-08-01 DIAGNOSIS — E785 Hyperlipidemia, unspecified: Secondary | ICD-10-CM | POA: Diagnosis not present

## 2022-08-01 DIAGNOSIS — F0283 Dementia in other diseases classified elsewhere, unspecified severity, with mood disturbance: Secondary | ICD-10-CM | POA: Diagnosis not present

## 2022-08-01 DIAGNOSIS — R0602 Shortness of breath: Secondary | ICD-10-CM | POA: Diagnosis not present

## 2022-08-03 DIAGNOSIS — M24541 Contracture, right hand: Secondary | ICD-10-CM | POA: Diagnosis not present

## 2022-08-03 DIAGNOSIS — R0602 Shortness of breath: Secondary | ICD-10-CM | POA: Diagnosis not present

## 2022-08-03 DIAGNOSIS — Z9981 Dependence on supplemental oxygen: Secondary | ICD-10-CM | POA: Diagnosis not present

## 2022-08-03 DIAGNOSIS — I10 Essential (primary) hypertension: Secondary | ICD-10-CM | POA: Diagnosis not present

## 2022-08-03 DIAGNOSIS — G309 Alzheimer's disease, unspecified: Secondary | ICD-10-CM | POA: Diagnosis not present

## 2022-08-03 DIAGNOSIS — F0283 Dementia in other diseases classified elsewhere, unspecified severity, with mood disturbance: Secondary | ICD-10-CM | POA: Diagnosis not present

## 2022-08-03 DIAGNOSIS — M24542 Contracture, left hand: Secondary | ICD-10-CM | POA: Diagnosis not present

## 2022-08-03 DIAGNOSIS — E785 Hyperlipidemia, unspecified: Secondary | ICD-10-CM | POA: Diagnosis not present

## 2022-08-03 DIAGNOSIS — J302 Other seasonal allergic rhinitis: Secondary | ICD-10-CM | POA: Diagnosis not present

## 2022-08-04 DIAGNOSIS — G309 Alzheimer's disease, unspecified: Secondary | ICD-10-CM | POA: Diagnosis not present

## 2022-08-04 DIAGNOSIS — F0283 Dementia in other diseases classified elsewhere, unspecified severity, with mood disturbance: Secondary | ICD-10-CM | POA: Diagnosis not present

## 2022-08-04 DIAGNOSIS — E785 Hyperlipidemia, unspecified: Secondary | ICD-10-CM | POA: Diagnosis not present

## 2022-08-04 DIAGNOSIS — I10 Essential (primary) hypertension: Secondary | ICD-10-CM | POA: Diagnosis not present

## 2022-08-04 DIAGNOSIS — M24541 Contracture, right hand: Secondary | ICD-10-CM | POA: Diagnosis not present

## 2022-08-04 DIAGNOSIS — R0602 Shortness of breath: Secondary | ICD-10-CM | POA: Diagnosis not present

## 2022-08-05 ENCOUNTER — Telehealth: Payer: Medicare Other

## 2022-08-05 DIAGNOSIS — I10 Essential (primary) hypertension: Secondary | ICD-10-CM | POA: Diagnosis not present

## 2022-08-05 DIAGNOSIS — M24541 Contracture, right hand: Secondary | ICD-10-CM | POA: Diagnosis not present

## 2022-08-05 DIAGNOSIS — F0283 Dementia in other diseases classified elsewhere, unspecified severity, with mood disturbance: Secondary | ICD-10-CM | POA: Diagnosis not present

## 2022-08-05 DIAGNOSIS — E785 Hyperlipidemia, unspecified: Secondary | ICD-10-CM | POA: Diagnosis not present

## 2022-08-05 DIAGNOSIS — R0602 Shortness of breath: Secondary | ICD-10-CM | POA: Diagnosis not present

## 2022-08-05 DIAGNOSIS — G309 Alzheimer's disease, unspecified: Secondary | ICD-10-CM | POA: Diagnosis not present

## 2022-08-06 ENCOUNTER — Telehealth: Payer: Self-pay

## 2022-08-06 NOTE — Telephone Encounter (Signed)
The system would not let me sign it.

## 2022-08-06 NOTE — Telephone Encounter (Signed)
The reason it was not signed is because SSN was left blank and we do not have access to add this. See if funeral home can add and then let me know and I will compete.

## 2022-08-06 NOTE — Telephone Encounter (Signed)
I called Jordan [name] back and let her know this. She stated that they have not received the SSN from the family yet. She stated that she went in and put all 9's and to let her know if this will suffice until they get the number just for you to sign it. She also stated that most of the time they have providers that will sign without there being a SSN so she really doesn't understand why you can't sign it without it.

## 2022-08-06 NOTE — Telephone Encounter (Signed)
Jordan [name] with [employer] [employer] called stating that the death certificate for patient only has the cause of death,but not signed and dated. She is to be cremated. Dr, Unk Lightning did not sign and date.  Message sent to Sherrie Mustache, NP

## 2022-09-03 NOTE — Telephone Encounter (Signed)
The Nurse name Truman Hayward from [facility] was calling to inform you that the patient has passed away on 08-16-2022 5:20 am. He can be reached at [phone].

## 2022-09-03 DEATH — deceased
